# Patient Record
Sex: Female | Born: 1950 | Hispanic: No | Marital: Married | Smoking: Never smoker
Health system: Southern US, Community
[De-identification: ages and names within clinical notes are randomized; demographics above are authoritative.]

## PROBLEM LIST (undated history)

## (undated) DIAGNOSIS — K219 Gastro-esophageal reflux disease without esophagitis: Secondary | ICD-10-CM

## (undated) DIAGNOSIS — M25473 Effusion, unspecified ankle: Secondary | ICD-10-CM

## (undated) DIAGNOSIS — I82409 Acute embolism and thrombosis of unspecified deep veins of unspecified lower extremity: Secondary | ICD-10-CM

## (undated) DIAGNOSIS — R519 Headache, unspecified: Secondary | ICD-10-CM

## (undated) DIAGNOSIS — R51 Headache: Secondary | ICD-10-CM

## (undated) DIAGNOSIS — O2230 Deep phlebothrombosis in pregnancy, unspecified trimester: Secondary | ICD-10-CM

## (undated) DIAGNOSIS — O2200 Varicose veins of lower extremity in pregnancy, unspecified trimester: Secondary | ICD-10-CM

## (undated) DIAGNOSIS — M542 Cervicalgia: Secondary | ICD-10-CM

## (undated) DIAGNOSIS — J309 Allergic rhinitis, unspecified: Secondary | ICD-10-CM

## (undated) HISTORY — DX: Gastro-esophageal reflux disease without esophagitis: K21.9

## (undated) HISTORY — DX: Effusion, unspecified ankle: M25.473

## (undated) HISTORY — DX: Allergic rhinitis, unspecified: J30.9

## (undated) HISTORY — DX: Cervicalgia: M54.2

## (undated) HISTORY — DX: Varicose veins of lower extremity in pregnancy, unspecified trimester: O22.00

## (undated) HISTORY — DX: Headache, unspecified: R51.9

## (undated) HISTORY — PX: OTHER SURGICAL HISTORY: SHX169

## (undated) HISTORY — DX: Headache: R51

## (undated) HISTORY — PX: VARICOSE VEIN SURGERY: SHX832

---
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
## 2006-05-19 DIAGNOSIS — I82409 Acute embolism and thrombosis of unspecified deep veins of unspecified lower extremity: Secondary | ICD-10-CM

## 2006-05-19 HISTORY — DX: Acute embolism and thrombosis of unspecified deep veins of unspecified lower extremity: I82.409

## 2007-05-23 ENCOUNTER — Emergency Department: Payer: Self-pay | Admitting: Emergency Medicine

## 2007-05-24 ENCOUNTER — Other Ambulatory Visit: Payer: Self-pay

## 2007-05-24 IMAGING — CR DG KNEE COMPLETE 4+V*L*
1 series · 4 of 4 positions shown · non-contrast
Comparison: none

REASON FOR EXAM: Fall
COMMENTS:

PROCEDURE:     DXR - DXR KNEE LT COMP WITH OBLIQUES  - [DATE] [DATE]
RESULT:     No fracture, dislocation or other acute bony abnormality is
identified. There is slight degenerative spurring at the knee joint
laterally. The patella is intact.

[Series 1: view not recorded · 0.17mm/px · 4 of 4 slices shown]
[im 1/4]
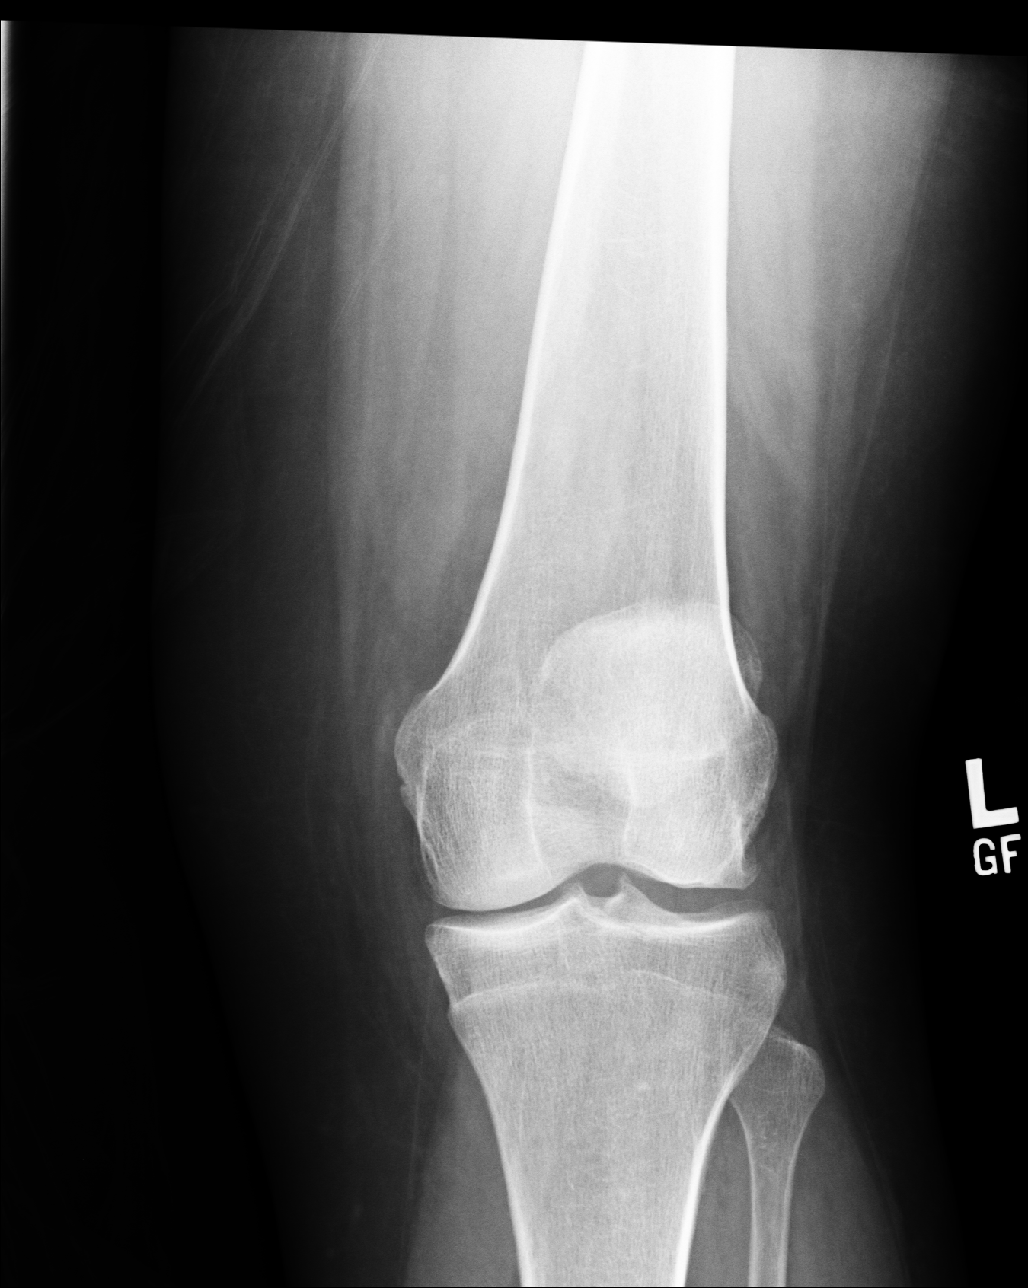
[im 2/4]
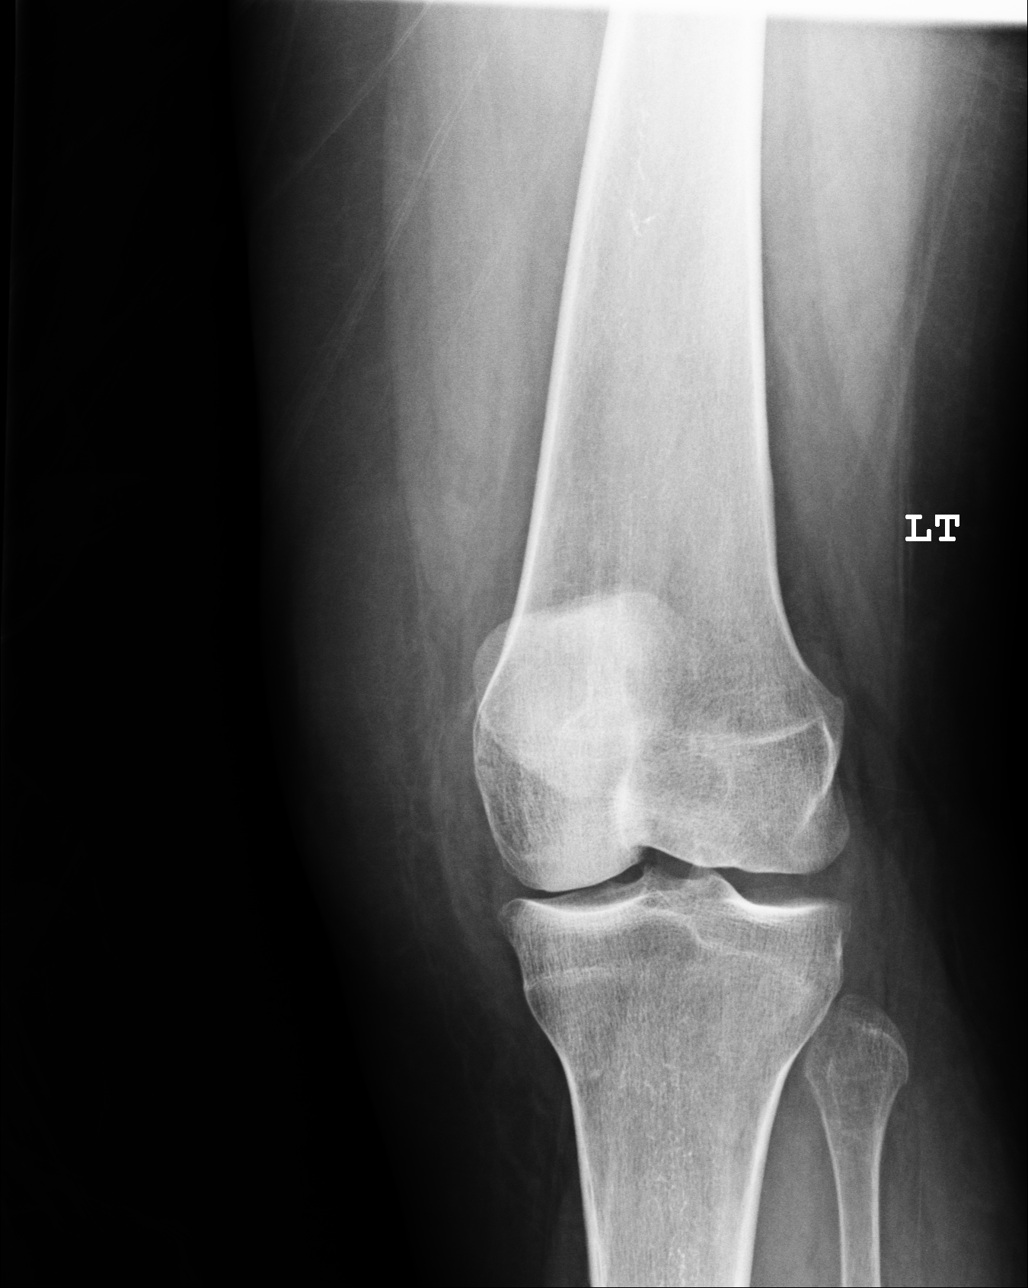
[im 3/4]
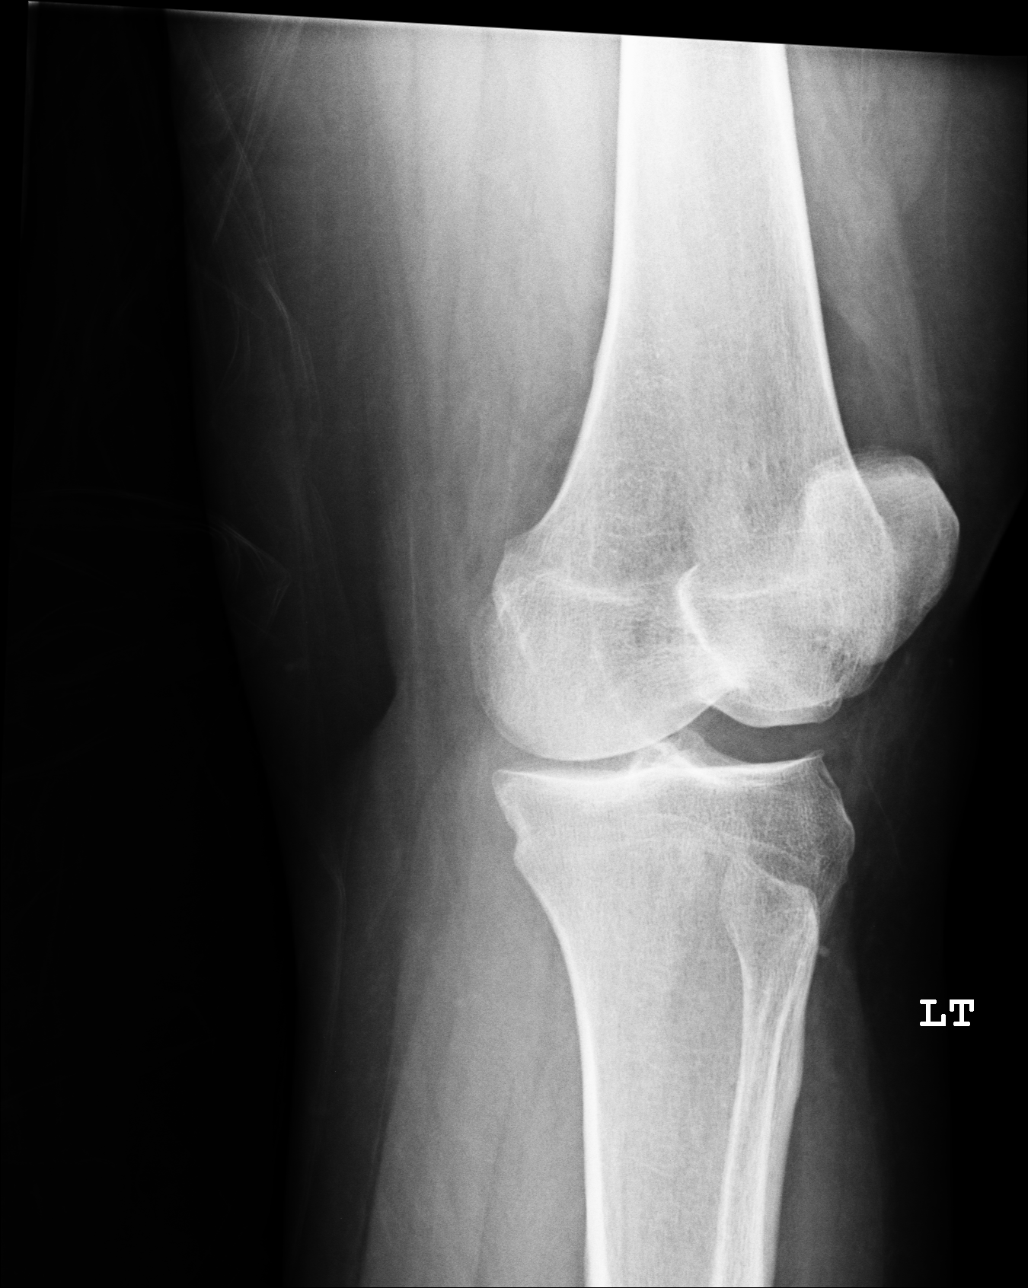
[im 4/4]
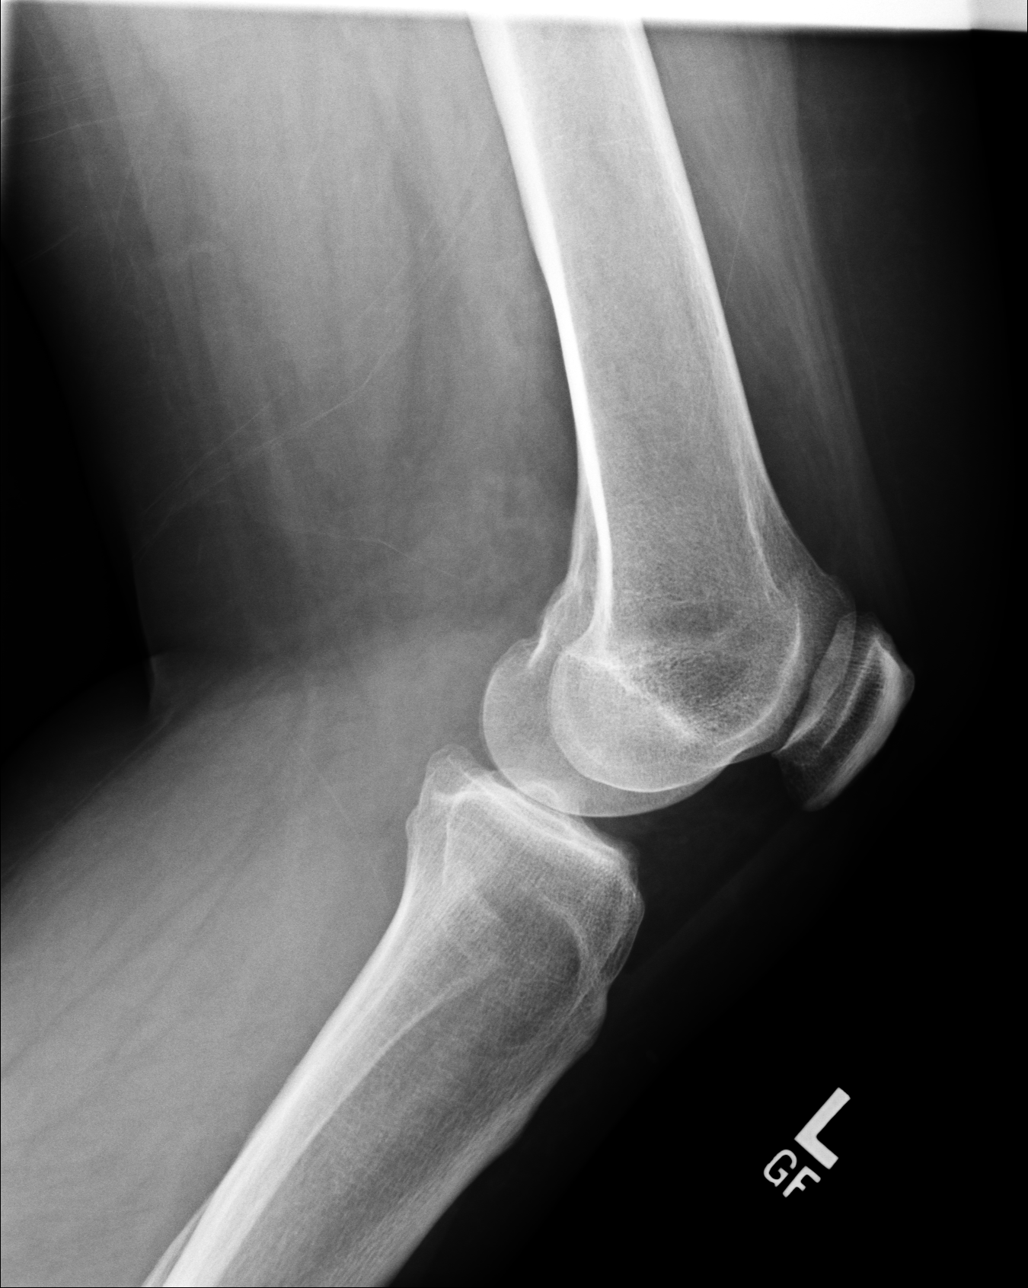

[4 of 4 positions shown; findings below may reference images not displayed]

IMPRESSION: 1.  No acute changes are identified.
2.  There is slight degenerative spurring at the knee joint laterally.

## 2007-06-18 ENCOUNTER — Ambulatory Visit: Payer: Self-pay | Admitting: Specialist

## 2008-02-25 ENCOUNTER — Observation Stay: Payer: Self-pay | Admitting: Internal Medicine

## 2008-02-25 ENCOUNTER — Other Ambulatory Visit: Payer: Self-pay

## 2009-07-06 ENCOUNTER — Observation Stay (HOSPITAL_COMMUNITY): Admission: EM | Admit: 2009-07-06 | Discharge: 2009-07-06 | Payer: Self-pay | Admitting: Emergency Medicine

## 2010-04-17 ENCOUNTER — Emergency Department: Payer: Self-pay | Admitting: Emergency Medicine

## 2010-08-08 LAB — CBC
HCT: 40.6 % (ref 36.0–46.0)
Hemoglobin: 13.8 g/dL (ref 12.0–15.0)
MCHC: 33.8 g/dL (ref 30.0–36.0)
MCV: 89.4 fL (ref 78.0–100.0)
Platelets: 242 10*3/uL (ref 150–400)
RBC: 4.55 MIL/uL (ref 3.87–5.11)
RDW: 13.3 % (ref 11.5–15.5)
WBC: 9 10*3/uL (ref 4.0–10.5)

## 2010-08-08 LAB — DIFFERENTIAL
Basophils Absolute: 0.1 10*3/uL (ref 0.0–0.1)
Basophils Relative: 1 % (ref 0–1)
Eosinophils Absolute: 0.1 10*3/uL (ref 0.0–0.7)
Eosinophils Relative: 1 % (ref 0–5)
Lymphocytes Relative: 26 % (ref 12–46)
Lymphs Abs: 2.3 10*3/uL (ref 0.7–4.0)
Monocytes Absolute: 1 10*3/uL (ref 0.1–1.0)
Monocytes Relative: 11 % (ref 3–12)
Neutro Abs: 5.5 10*3/uL (ref 1.7–7.7)
Neutrophils Relative %: 62 % (ref 43–77)

## 2010-08-08 LAB — POCT I-STAT, CHEM 8
BUN: 8 mg/dL (ref 6–23)
Calcium, Ion: 1.15 mmol/L (ref 1.12–1.32)
Chloride: 107 mEq/L (ref 96–112)
Creatinine, Ser: 0.7 mg/dL (ref 0.4–1.2)
Glucose, Bld: 104 mg/dL — ABNORMAL HIGH (ref 70–99)
HCT: 43 % (ref 36.0–46.0)
Hemoglobin: 14.6 g/dL (ref 12.0–15.0)
Potassium: 4.1 mEq/L (ref 3.5–5.1)
Sodium: 139 mEq/L (ref 135–145)
TCO2: 26 mmol/L (ref 0–100)

## 2011-08-01 ENCOUNTER — Other Ambulatory Visit: Payer: Self-pay | Admitting: Nephrology

## 2011-08-01 ENCOUNTER — Ambulatory Visit
Admission: RE | Admit: 2011-08-01 | Discharge: 2011-08-01 | Disposition: A | Discharge status: Home / Self Care | Payer: Medicaid Other | Source: Ambulatory Visit | Attending: Nephrology | Admitting: Nephrology

## 2011-08-01 DIAGNOSIS — R059 Cough, unspecified: Secondary | ICD-10-CM

## 2011-08-01 DIAGNOSIS — R05 Cough: Secondary | ICD-10-CM

## 2011-10-12 ENCOUNTER — Emergency Department (HOSPITAL_COMMUNITY)
Admission: EM | Admit: 2011-10-12 | Discharge: 2011-10-12 | Disposition: A | Discharge status: Home / Self Care | Payer: Medicaid Other | Attending: Emergency Medicine | Admitting: Emergency Medicine

## 2011-10-12 ENCOUNTER — Encounter (HOSPITAL_COMMUNITY): Payer: Self-pay | Admitting: Family Medicine

## 2011-10-12 DIAGNOSIS — M7989 Other specified soft tissue disorders: Secondary | ICD-10-CM

## 2011-10-12 DIAGNOSIS — I872 Venous insufficiency (chronic) (peripheral): Secondary | ICD-10-CM

## 2011-10-12 DIAGNOSIS — M79609 Pain in unspecified limb: Secondary | ICD-10-CM | POA: Insufficient documentation

## 2011-10-12 HISTORY — DX: Deep phlebothrombosis in pregnancy, unspecified trimester: O22.30

## 2011-10-12 NOTE — ED Notes (Signed)
Per pt having left leg swelling and pain x 1week. Hx of DVT

## 2011-10-12 NOTE — Discharge Instructions (Signed)
Venous Stasis and Chronic Venous Insufficiency As people age, the veins located in their legs may weaken and stretch. When veins weaken and lose the ability to pump blood effectively, the condition is called chronic venous insufficiency (CVI) or venous stasis. Almost all veins return blood back to the heart. This happens by:  The force of the heart pumping fresh blood pushes blood back to the heart.   Blood flowing to the heart from the force of gravity.  In the deep veins of the legs, blood has to fight gravity and flow upstream back to the heart. Here, the leg muscles contract to pump blood back toward the heart. Vein walls are elastic, and many veins have small valves that only allow blood to flow in one direction. When leg muscles contract, they push inward against the elastic vein walls. This squeezes blood upward, opens the valves, and moves blood toward the heart. When leg muscles relax, the vein wall also relaxes and the valves inside the vein close to prevent blood from flowing backward. This method of pumping blood out of the legs is called the venous pump. CAUSES  The venous pump works best while walking and leg muscles are contracting. But when a person sits or stands, blood pressure in leg veins can build. Deep veins are usually able to withstand short periods of inactivity, but long periods of inactivity (and increased pressure) can stretch, weaken, and damage vein walls. High blood pressure can also stretch and damage vein walls. The veins may no longer be able to pump blood back to the heart. Venous hypertension (high blood pressure inside veins) that lasts over time is a primary cause of CVI. CVI can also be caused by:   Deep vein thrombosis, a condition where a thrombus (blood clot) blocks blood flow in a vein.   Phlebitis, an inflammation of a superficial vein that causes a blood clot to form.  Other risk factors for CVI may include:   Heredity.   Obesity.   Pregnancy.    Sedentary lifestyle.   Smoking.   Jobs requiring long periods of standing or sitting in one place.   Age and gender:   Women in their 40's and 50's and men in their 70's are more prone to developing CVI.  SYMPTOMS  Symptoms of CVI may include:   Varicose veins.   Ulceration or skin breakdown.   Lipodermatosclerosis, a condition that affects the skin just above the ankle, usually on the inside surface. Over time the skin becomes Gallardo, smooth, tight and often painful. Those with this condition have a high risk of developing skin ulcers.   Reddened or discolored skin on the leg.   Swelling.  DIAGNOSIS  Your caregiver can diagnose CVI after performing a careful medical history and physical examination. To confirm the diagnosis, the following tests may also be ordered:   Duplex ultrasound.   Plethysmography (tests blood flow).   Venograms (x-ray using a special dye).  TREATMENT The goals of treatment for CVI are to restore a person to an active life and to minimize pain or disability. Typically, CVI does not pose a serious threat to life or limb, and with proper treatment most people with this condition can continue to lead active lives. In most cases, mild CVI can be treated on an outpatient basis with simple procedures. Treatment methods include:   Elastic compression socks.   Sclerotherapy, a procedure involving an injection of a material that "dissolves" the damaged veins. Other veins in the network   of blood vessels take over the function of the damaged veins.   Vein stripping (an older procedure less commonly used).   Laser Ablation surgery.   Valve repair.  HOME CARE INSTRUCTIONS   Elastic compression socks must be worn every day. They can help with symptoms and lower the chances of the problem getting worse, but they do not cure the problem.   Only take over-the-counter or prescription medicines for pain, discomfort, or fever as directed by your caregiver.    Your caregiver will review your other medications with you.  SEEK MEDICAL CARE IF:   You are confused about how to take your medications.   There is redness, swelling, or increasing pain in the affected area.   There is a red streak or line that extends up or down from the affected area.   There is a breakdown or loss of skin in the affected area, even if the breakdown is small.   You develop an unexplained oral temperature above 102 F (38.9 C).   There is an injury to the affected area.  SEEK IMMEDIATE MEDICAL CARE IF:   There is an injury and open wound to the affected area.   Pain is not adequately relieved with pain medication prescribed or becomes severe.   An oral temperature above 102 F (38.9 C) develops.   The foot/ankle below the affected area becomes suddenly numb or the area feels weak and hard to move.  MAKE SURE YOU:   Understand these instructions.   Will watch your condition.   Will get help right away if you are not doing well or get worse.  Document Released: 09/08/2006 Document Revised: 04/24/2011 Document Reviewed: 11/16/2006 [employer] Patient Information 2012 [employer]. 

## 2011-10-12 NOTE — Progress Notes (Signed)
VASCULAR LAB PRELIMINARY  PRELIMINARY  PRELIMINARY  PRELIMINARY  Left lower extremity venous Doppler completed.    Preliminary report:  There is no DVT or SVT noted in the bilateral lower extremities.  Enlarged lymph nodes noted in the bilateral groins.  Pamela Flores, 10/12/2011, 5:39 PM

## 2011-10-12 NOTE — ED Provider Notes (Signed)
History   This chart was scribed for Lyanne Co, MD by Toya Smothers. The patient was seen in room STRE2/STRE2. Patient's care was started at 1418.  CSN: [id number]  Arrival date & time 10/12/11  1418   First MD Initiated Contact with Patient 10/12/11 1524      Chief Complaint  Patient presents with  . Leg Swelling    HPI  Pamela Flores is a 61 y.o. female who presents to the Emergency Department complaining of gradual onset moderate severe constant leg swelling in both legs onset 1 week ago with associated black spotting. denying chest pain and SOB. Pt states that her ankles typically experience swelling, however swelling in the legs has not occurred before. Pt has h/o DVT, is not a smoker or drinker.  Her symptoms are mild to moderate in severity.  She denies chest pain or shortness breath  Pt list PCP as Dr. Cory Roughen  Past Medical History  Diagnosis Date  . DVT (deep vein thrombosis) in pregnancy     History reviewed. No pertinent past surgical history.  History reviewed. No pertinent family history.  History  Substance Use Topics  . Smoking status: Never Smoker   . Smokeless tobacco: Not on file  . Alcohol Use: No   Review of Systems  Constitutional: Negative for fever and chills.  Respiratory: Negative for shortness of breath.   Cardiovascular: Positive for leg swelling.  Gastrointestinal: Negative for nausea and vomiting.  Skin: Positive for color change.  Neurological: Negative for weakness.    Allergies  Review of patient's allergies indicates no known allergies.  Home Medications  No current outpatient prescriptions on file.  BP 114/79  Pulse 62  Temp(Src) 98.1 F (36.7 C) (Oral)  Resp 21  SpO2 100%  Physical Exam  Nursing note and vitals reviewed. Constitutional: She is oriented to person, place, and time. She appears well-developed and well-nourished. No distress.  HENT:  Head: Normocephalic and atraumatic.  Eyes: EOM are normal.  Neck:  Neck supple. No tracheal deviation present.  Cardiovascular: Normal rate.   Pulmonary/Chest: Effort normal. No respiratory distress.  Musculoskeletal: Normal range of motion.       Normal dp and dp foot . Swelling greater in L leg than R. 1+ edema in L leg. Tenderness to L calf with palpation. No tenderness or swelling of thighs bilaterally.  Neurological: She is alert and oriented to person, place, and time.  Skin: Skin is warm and dry.  Psychiatric: She has a normal mood and affect. Her behavior is normal.    ED Course  Procedures (including critical care time)  DIAGNOSTIC STUDIES: Oxygen Saturation is 100% on room air, normal by my interpretation.    COORDINATION OF CARE:  Labs Reviewed - No data to display No results found.   1. Venous insufficiency       MDM  No evidence of DVT on venous duplex imaging at bedside.  Please see complete hospital chart for details of the ultrasound.  I recommended compression stockings elevation and reduction in the salt in her diet.  I also recommended) care followup.  There are no signs of infection at this time.  She is neurovascularly intact.  I think this is chronic venous insufficiencyd    I personally performed the services described in this documentation, which was scribed in my presence. The recorded information has been reviewed and considered.     Lyanne Co, MD 10/12/11 1750

## 2012-08-10 ENCOUNTER — Encounter (HOSPITAL_COMMUNITY): Payer: Self-pay | Admitting: Emergency Medicine

## 2012-08-10 ENCOUNTER — Emergency Department (INDEPENDENT_AMBULATORY_CARE_PROVIDER_SITE_OTHER)
Admission: EM | Admit: 2012-08-10 | Discharge: 2012-08-10 | Disposition: A | Discharge status: Home / Self Care | Payer: Medicaid Other | Source: Home / Self Care | Attending: Family Medicine | Admitting: Family Medicine

## 2012-08-10 DIAGNOSIS — I8310 Varicose veins of unspecified lower extremity with inflammation: Secondary | ICD-10-CM

## 2012-08-10 DIAGNOSIS — I8312 Varicose veins of left lower extremity with inflammation: Secondary | ICD-10-CM

## 2012-08-10 NOTE — ED Provider Notes (Signed)
History     CSN: [id number]  Arrival date & time 08/10/12  1623   First MD Initiated Contact with Patient 08/10/12 1626      Chief Complaint  Patient presents with  . Leg Swelling    lower left leg swelling with discoloration  . Cough    dry nonproductive cough. that is persistant    (Consider location/radiation/quality/duration/timing/severity/associated sxs/prior treatment) Patient is a 62 y.o. female presenting with leg pain. The history is provided by the patient.  Leg Pain Location:  Leg Leg location:  L lower leg Pain details:    Quality:  Burning and cramping   Severity:  Mild   Duration:  12 months   Progression:  Worsening (has had doppler done prev and told just to wear support hose on leg, sx getting worse.)   Past Medical History  Diagnosis Date  . DVT (deep vein thrombosis) in pregnancy     History reviewed. No pertinent past surgical history.  History reviewed. No pertinent family history.  History  Substance Use Topics  . Smoking status: Never Smoker   . Smokeless tobacco: Not on file  . Alcohol Use: No    OB History   Grav Para Term Preterm Abortions TAB SAB Ect Mult Living                  Review of Systems  Constitutional: Negative.   Musculoskeletal: Negative for joint swelling and gait problem.  Skin: Positive for color change and rash.    Allergies  Review of patient's allergies indicates no known allergies.  Home Medications  No current outpatient prescriptions on file.  BP 129/89  Pulse 69  Temp(Src) 98.4 F (36.9 C) (Oral)  Resp 20  SpO2 100%  Physical Exam  Nursing note and vitals reviewed. Constitutional: She is oriented to person, place, and time. She appears well-developed and well-nourished.  Neurological: She is alert and oriented to person, place, and time.  Skin: Skin is warm and dry. Rash noted.  Edema and stasis dermatitis skin changes to left lower leg, prominent varicosities, no ulcerations, no  erythema.pulse 2+.    ED Course  Procedures (including critical care time)  Labs Reviewed - No data to display No results found.   1. Chronic stasis dermatitis, left       MDM          Linna Hoff, MD 08/10/12 [phone]

## 2012-08-10 NOTE — ED Notes (Addendum)
Pt c/o left lower leg swelling with discoloration. Having sharp, shooting pain. Symptoms present x 2 months.   Pt has been using compression stockings with mild relief. Hx of DVT  No hx of diabetes. Pt is also c/o a dry nonproductive cough that has been persistent over the past several weeks. Denies chest pain and sob

## 2012-11-05 ENCOUNTER — Telehealth: Payer: Self-pay | Admitting: *Deleted

## 2012-11-05 NOTE — Telephone Encounter (Signed)
Patient called in to triage re: Leg pain and swelling due to VV and venous stasis. Seen in urgent care a couple of months ago according to [facility].  Patient has not been seen by our doctors and does not have an appt to see [place] as of yet: I left msg for her to call our appt desk and make appt as a self referral for VV and venous stasis problems.

## 2012-11-11 NOTE — ED Notes (Signed)
Spoke to [place] at dr lawson's office.  Pamela Flores is sending referral to [facility] to be completed and sent back.

## 2012-11-15 ENCOUNTER — Other Ambulatory Visit: Payer: Self-pay | Admitting: *Deleted

## 2012-11-15 DIAGNOSIS — I83893 Varicose veins of bilateral lower extremities with other complications: Secondary | ICD-10-CM

## 2012-12-10 ENCOUNTER — Encounter: Payer: Self-pay | Admitting: Surgery

## 2012-12-13 ENCOUNTER — Encounter (INDEPENDENT_AMBULATORY_CARE_PROVIDER_SITE_OTHER): Payer: Self-pay | Admitting: Vascular Surgery

## 2012-12-13 ENCOUNTER — Ambulatory Visit (INDEPENDENT_AMBULATORY_CARE_PROVIDER_SITE_OTHER): Payer: Self-pay | Admitting: Vascular Surgery

## 2012-12-13 ENCOUNTER — Encounter: Payer: Self-pay | Admitting: Vascular Surgery

## 2012-12-13 VITALS — BP 123/80 | HR 71 | Resp 16 | Ht 68.0 in | Wt 215.0 lb

## 2012-12-13 DIAGNOSIS — I83893 Varicose veins of bilateral lower extremities with other complications: Secondary | ICD-10-CM

## 2012-12-13 DIAGNOSIS — M7989 Other specified soft tissue disorders: Secondary | ICD-10-CM

## 2012-12-13 DIAGNOSIS — Z86718 Personal history of other venous thrombosis and embolism: Secondary | ICD-10-CM

## 2012-12-13 NOTE — Progress Notes (Signed)
Subjective:     Patient ID: Pamela Flores, female   DOB: [birth year], 62 y.o.   MRN: [id number]  HPI this 60-year-old female is referred for evaluation of pain and swelling in the left leg. She states that 4 years ago she fell and injured her left leg. It was later discovered that she had developed a DVT. She was treated with Coumadin for one year and she quit taking the Coumadin on her own. She is not take aspirin. She has no history of other thrombotic problems including pulmonary embolus. She has noted some mild swelling in the contralateral right leg. She has aching and throbbing discomfort throughout the day. She has no history of bleeding or stasis ulcers although she has noticed a small "scar" in the posterior aspect of her left ankle. She is not wear elastic compression stockings nor elevate her legs on a regular basis.  Past Medical History  Diagnosis Date  . DVT (deep vein thrombosis) in pregnancy     History  Substance Use Topics  . Smoking status: Never Smoker   . Smokeless tobacco: Not on file  . Alcohol Use: No    History reviewed. No pertinent family history.  No Known Allergies  No current outpatient prescriptions on file.  BP 123/80  Pulse 71  Resp 16  Ht 5\' 8"  (1.727 m)  Wt 215 lb (97.523 kg)  BMI 32.7 kg/m2  Body mass index is 32.7 kg/(m^2).          Review of Systems denies chest pain, dyspnea on exertion, PND, orthopnea. Does have a history of productive cough, swelling in legs, history of DVT. Other systems negative and complete review of systems    Objective:   Physical Exam blood pressure 120 for bradycardia heart rate 71 respirations 16 Gen.-alert and oriented x3 in no apparent distress HEENT normal for age Lungs no rhonchi or wheezing Cardiovascular regular rhythm no murmurs carotid pulses 3+ palpable no bruits audible Abdomen soft nontender no palpable masses Musculoskeletal free of  major deformities Skin clear -no rashes Neurologic  normal Lower extremities 3+ femoral and dorsalis pedis pulses palpable bilaterally with 2+ edema left calf ankle and foot and 1+ edema right lower extremity below the knee. No active ulceration noted in the left leg although there is a small eschar measuring about 3 mm in size and posterior laterally near the lateral malleolus which she states has been present for several months. No bulging varicosities noted. Spider veins bilaterally and medial and lateral anterior thigh area.  Today I ordered a venous duplex exam of the left leg which are reviewed and interpreted. Patient has findings consistent with chronic DVT which is nonocclusive in the popliteal and distal superficial femoral vein. There is incompetence of the left small saphenous vein which has some nonoccluding thrombus in the proximal calf segment. The left great saphenous vein is competent.     Assessment:     Chronic venous insufficiency left leg secondary to chronic DVT with episode of chronic superficial thrombophlebitis and small saphenous vein which occurred in the past Chronic pain and swelling left leg secondary to above    Plan:    #1 short leg elastic compression stockings 20-30 mm gradient #2 elevate foot of bed 3-4 inches and apply stockings first laying in the a.m. to both lower extremities #3 no further suggestions-no need for chronic anticoagulation at this point unless she develops further DVT

## 2013-07-28 ENCOUNTER — Emergency Department: Payer: Self-pay | Admitting: Emergency Medicine

## 2013-08-03 ENCOUNTER — Emergency Department: Payer: Self-pay | Admitting: Internal Medicine

## 2013-09-26 ENCOUNTER — Encounter: Payer: Self-pay | Admitting: Vascular Surgery

## 2013-09-26 ENCOUNTER — Other Ambulatory Visit: Payer: Self-pay | Admitting: *Deleted

## 2013-09-26 DIAGNOSIS — M7989 Other specified soft tissue disorders: Secondary | ICD-10-CM

## 2013-11-14 ENCOUNTER — Encounter: Payer: Self-pay | Admitting: Vascular Surgery

## 2013-11-15 ENCOUNTER — Encounter: Payer: 59 | Admitting: Vascular Surgery

## 2013-11-15 ENCOUNTER — Ambulatory Visit (HOSPITAL_COMMUNITY)
Admission: RE | Admit: 2013-11-15 | Discharge: 2013-11-15 | Disposition: A | Discharge status: Home / Self Care | Payer: 59 | Source: Ambulatory Visit | Attending: Vascular Surgery | Admitting: Vascular Surgery

## 2013-11-15 DIAGNOSIS — M7989 Other specified soft tissue disorders: Secondary | ICD-10-CM

## 2013-11-21 ENCOUNTER — Encounter: Payer: Self-pay | Admitting: Vascular Surgery

## 2013-11-22 ENCOUNTER — Encounter: Payer: Self-pay | Admitting: Vascular Surgery

## 2013-11-22 ENCOUNTER — Ambulatory Visit (INDEPENDENT_AMBULATORY_CARE_PROVIDER_SITE_OTHER): Payer: 59 | Admitting: Vascular Surgery

## 2013-11-22 VITALS — BP 120/83 | HR 71 | Resp 16 | Ht 68.0 in | Wt 235.0 lb

## 2013-11-22 DIAGNOSIS — M79669 Pain in unspecified lower leg: Secondary | ICD-10-CM | POA: Insufficient documentation

## 2013-11-22 DIAGNOSIS — M79609 Pain in unspecified limb: Secondary | ICD-10-CM

## 2013-11-22 DIAGNOSIS — I83893 Varicose veins of bilateral lower extremities with other complications: Secondary | ICD-10-CM

## 2013-11-22 DIAGNOSIS — M7989 Other specified soft tissue disorders: Secondary | ICD-10-CM

## 2013-11-22 NOTE — Progress Notes (Signed)
The patient has today for continued discussion of her chronic venous hypertension. She saw Dr. Kellie Simmering one year ago. She had an ultrasound that time and she recently had a repeat ultrasound on 11/15/2013 and I am reviewing this with her today. She does have a history of DVT after a fall in her left leg artery 4-5 years ago. She has had progression of her venous hypertension since that time. She reports this is much worse on the left side but she does have some swelling in her right side as well. She does wear her compression at times but is concern regarding the appearance of these. She does elevate when possible as well. She's had no new symptoms of DVT.  Past Medical History  Diagnosis Date  . DVT (deep vein thrombosis) in pregnancy   . Persistent headaches   . Neck pain   . Ankle swelling     History  Substance Use Topics  . Smoking status: Never Smoker   . Smokeless tobacco: Never Used  . Alcohol Use: No    No family history on file.  No Known Allergies  No current outpatient prescriptions on file.  BP 120/83  Pulse 71  Resp 16  Ht 5\' 8"  (1.727 m)  Wt 235 lb (106.595 kg)  BMI 35.74 kg/m2  Body mass index is 35.74 kg/(m^2).       Physical exam well-developed female in no acute distress Grossly intact neurologically Significant swelling in her left leg versus right. She does have 2+ dorsalis pedis pulses bilaterally. On the left she does have significant hemosiderin deposit and thickening over medial to ankle above her malleolus. Does have some swelling onto her foot as well. No specific swelling in her right leg.  Venous duplex from 11/15/2013 was compared to her study from a year ago. This does show chronic nonocclusive DVT in her left femoral popliteal and posterior tibial veins does have some chronic thrombus in her right great saphenous vein and left small saphenous vein. Has deep venous reflux in bilateral common femoral femoral and tibial veins and left popliteal  vein.  I again discussed with the patient there is no specific treatment other than elevation and compression. The critical importance of this. Did explain she has had rather marked progression of her for 5 years since her DVT and this is concerning for continued skin changes. I explained this could progress to of venous ulceration. I think this is not life-threatening. She is frustrated that no other treatment options. She will continue with elevation and compression and see [place] on an as-needed basis

## 2016-09-22 ENCOUNTER — Emergency Department (HOSPITAL_COMMUNITY)
Admission: EM | Admit: 2016-09-22 | Discharge: 2016-09-22 | Disposition: A | Discharge status: Home / Self Care | Payer: Self-pay | Attending: Emergency Medicine | Admitting: Emergency Medicine

## 2016-09-22 ENCOUNTER — Encounter (HOSPITAL_COMMUNITY): Payer: Self-pay | Admitting: Emergency Medicine

## 2016-09-22 DIAGNOSIS — H1033 Unspecified acute conjunctivitis, bilateral: Secondary | ICD-10-CM | POA: Insufficient documentation

## 2016-09-22 DIAGNOSIS — M25471 Effusion, right ankle: Secondary | ICD-10-CM

## 2016-09-22 DIAGNOSIS — R600 Localized edema: Secondary | ICD-10-CM | POA: Insufficient documentation

## 2016-09-22 DIAGNOSIS — M25472 Effusion, left ankle: Secondary | ICD-10-CM

## 2016-09-22 LAB — CBC
HCT: 42 % (ref 36.0–46.0)
Hemoglobin: 13.7 g/dL (ref 12.0–15.0)
MCH: 29.4 pg (ref 26.0–34.0)
MCHC: 32.6 g/dL (ref 30.0–36.0)
MCV: 90.1 fL (ref 78.0–100.0)
Platelets: 217 10*3/uL (ref 150–400)
RBC: 4.66 MIL/uL (ref 3.87–5.11)
RDW: 13.1 % (ref 11.5–15.5)
WBC: 6.4 10*3/uL (ref 4.0–10.5)

## 2016-09-22 LAB — COMPREHENSIVE METABOLIC PANEL
ALT: 44 U/L (ref 14–54)
AST: 49 U/L — ABNORMAL HIGH (ref 15–41)
Albumin: 3.7 g/dL (ref 3.5–5.0)
Alkaline Phosphatase: 73 U/L (ref 38–126)
Anion gap: 7 (ref 5–15)
BUN: 14 mg/dL (ref 6–20)
CO2: 25 mmol/L (ref 22–32)
Calcium: 9.5 mg/dL (ref 8.9–10.3)
Chloride: 108 mmol/L (ref 101–111)
Creatinine, Ser: 0.91 mg/dL (ref 0.44–1.00)
GFR calc Af Amer: >60 mL/min (ref >60)
GFR calc non Af Amer: >60 mL/min (ref >60)
Glucose, Bld: 89 mg/dL (ref 65–99)
Potassium: 3.9 mmol/L (ref 3.5–5.1)
Sodium: 140 mmol/L (ref 135–145)
Total Bilirubin: 0.7 mg/dL (ref 0.3–1.2)
Total Protein: 8 g/dL (ref 6.5–8.1)

## 2016-09-22 MED ORDER — TOBRAMYCIN 0.3 % OP SOLN
1.0000 [drp] | Freq: Once | OPHTHALMIC | Status: AC
  Administered 2016-09-22: 1 [drp] via OPHTHALMIC
  Filled 2016-09-22: qty 5

## 2016-09-22 NOTE — ED Provider Notes (Addendum)
[facility] DEPT Provider Note   CSN: [id number] Arrival date & time: 09/22/16  1321   By signing my name below, I, Pamela Flores, attest that this documentation has been prepared under the direction and in the presence of Lajean Saver, MD. Electronically Signed: Soijett Flores, ED Scribe. 09/22/16. 2:00 PM.  History   Chief Complaint Chief Complaint  Patient presents with  . Eye Drainage    HPI Pamela Flores is a 66 y.o. female who presents to the Emergency Department complaining of constant bilateral eye drainage onset 3 weeks ago. Pt reports associated bilateral eye crusting, bilateral eye redness, and cough. Pt has tried OTC eye drops and robitussin with no relief of her symptoms. Denies using new hair or facial products prior to the onset of her symptoms. She denies eye pain, rhinorrhea, sore throat, and any other symptoms. Denies having an eye doctor, wearing contacts, or hx of seasonal allergies.   She secondarily complains of bilateral foot and ankle swelling onset 3 weeks ago. She notes that she has a hx of LLE DVT s/p fall several years ago, but has never experienced BLE swelling. Pt hasn't tried any medications for the relief of her symptoms. Denies PMHx of CHF, kidney issues, liver issues, or change in diet prior to the onset of her symptoms. Denies SOB, bilateral foot pain, bilateral ankle pain, and any other symptoms. Denies having a PCP or taking daily prescription medications.   The history is provided by the patient. No language interpreter was used.    Past Medical History:  Diagnosis Date  . Ankle swelling   . DVT (deep vein thrombosis) in pregnancy ([place])   . Neck pain   . Persistent headaches     Patient Active Problem List   Diagnosis Date Noted  . Pain and swelling of lower leg 11/22/2013  . Varicose veins of lower extremities with other complications 05/27/3233    History reviewed. No pertinent surgical history.  OB History    No data available        Home Medications    Prior to Admission medications   Not on File    Family History History reviewed. No pertinent family history.  Social History Social History  Substance Use Topics  . Smoking status: Never Smoker  . Smokeless tobacco: Never Used  . Alcohol use No     Allergies   Patient has no known allergies.   Review of Systems Review of Systems  HENT: Negative for rhinorrhea and sore throat.   Eyes: Positive for discharge (bilaterally) and redness (bilaterally). Negative for pain.       +Bilateral eye crusting  Respiratory: Negative for shortness of breath.   Musculoskeletal: Positive for joint swelling (bilateral feet and ankles). Negative for arthralgias.     Physical Exam Updated Vital Signs BP 139/90 (BP Location: Left Arm)   Pulse 66   Temp 97.7 F (36.5 C) (Oral)   Resp 18   Ht 5\' 8"  (1.727 m)   Wt 235 lb (106.6 kg)   SpO2 100%   BMI 35.73 kg/m   Physical Exam  Constitutional: She appears well-developed and well-nourished. No distress.  HENT:  Head: Normocephalic and atraumatic.  Mouth/Throat: Uvula is midline, oropharynx is clear and moist and mucous membranes are normal.  Eyes: EOM are normal. Right conjunctiva is injected. Left conjunctiva is injected.  Conjunctiva injected with tearing bilaterally. Mild matting on lashes. No cellulitis  Neck: Neck supple.  Cardiovascular: Normal rate, regular rhythm and normal heart sounds.  Exam reveals no gallop and no friction rub.   No murmur heard. Pulmonary/Chest: Effort normal and breath sounds normal. No respiratory distress. She has no wheezes. She has no rales.  Abdominal: She exhibits no distension.  Musculoskeletal: Normal range of motion. She exhibits edema.       Right ankle: She exhibits swelling.       Left ankle: She exhibits swelling.       Right foot: There is swelling.       Left foot: There is swelling.  Moderate bilateral foot and ankle edema,symmetric. Distal pulse palp bil.    Neurological: She is alert.  Skin: Skin is warm and dry. No rash noted.  Psychiatric: She has a normal mood and affect. Her behavior is normal.  Nursing note and vitals reviewed.    ED Treatments / Results  DIAGNOSTIC STUDIES: Oxygen Saturation is 100% on RA, nl by my interpretation.    COORDINATION OF CARE: 1:57 PM Discussed treatment plan with pt at bedside which includes labs and pt agreed to plan.   Labs (all labs ordered are listed, but only abnormal results are displayed) Results for orders placed or performed during the hospital encounter of 09/22/16  CBC  Result Value Ref Range   WBC 6.4 4.0 - 10.5 K/uL   RBC 4.66 3.87 - 5.11 MIL/uL   Hemoglobin 13.7 12.0 - 15.0 g/dL   HCT 42.0 36.0 - 46.0 %   MCV 90.1 78.0 - 100.0 fL   MCH 29.4 26.0 - 34.0 pg   MCHC 32.6 30.0 - 36.0 g/dL   RDW 13.1 11.5 - 15.5 %   Platelets 217 150 - 400 K/uL  Comprehensive metabolic panel  Result Value Ref Range   Sodium 140 135 - 145 mmol/L   Potassium 3.9 3.5 - 5.1 mmol/L   Chloride 108 101 - 111 mmol/L   CO2 25 22 - 32 mmol/L   Glucose, Bld 89 65 - 99 mg/dL   BUN 14 6 - 20 mg/dL   Creatinine, Ser 0.91 0.44 - 1.00 mg/dL   Calcium 9.5 8.9 - 10.3 mg/dL   Total Protein 8.0 6.5 - 8.1 g/dL   Albumin 3.7 3.5 - 5.0 g/dL   AST 49 (H) 15 - 41 U/L   ALT 44 14 - 54 U/L   Alkaline Phosphatase 73 38 - 126 U/L   Total Bilirubin 0.7 0.3 - 1.2 mg/dL   GFR calc non Af Amer >60 >60 mL/min   GFR calc Af Amer >60 >60 mL/min   Anion gap 7 5 - 15    Procedures Procedures (including critical care time)  Medications Ordered in ED Medications - No data to display   Initial Impression / Assessment and Plan / ED Course  I have reviewed the triage vital signs and the nursing notes.  Pertinent labs that were available during my care of the patient were reviewed by me and considered in my medical decision making (see chart for details).  Labs unremarkable.   Exam c/w conjunctivitis.    For edema bil  foot/ankle, rec elevation, limit salt intake and pcp f/u.  Final Clinical Impressions(s) / ED Diagnoses   Final diagnoses:  None    New Prescriptions New Prescriptions   No medications on file   I personally performed the services described in this documentation, which was scribed in my presence. The recorded information has been reviewed and considered. Lajean Saver, MD       Lajean Saver, MD 09/22/16 [phone]

## 2016-09-22 NOTE — ED Notes (Signed)
Declined W/C at D/C and was escorted to lobby by RN. 

## 2016-09-22 NOTE — ED Triage Notes (Signed)
Pt to ER for evaluation of watery eyes x3 weeks and bilateral foot swelling that has been persistent "for a while." denies other symptoms. Ambulatory at present. VSS.

## 2016-09-22 NOTE — Discharge Instructions (Signed)
It was our pleasure to provide your ER care today - we hope that you feel better.  Your lab work looks good/normal.  Use tobrex eye drops, 1-2 drops in each eye 4x/day for the next 5 days.  For feet/ankle swelling, limit salt intake, use compression stockings, and elevated feet as much as possible.  Follow up with primary care doctor in the next 1-2 weeks.   Return to ER if worse, severe eye pain, change in vision, trouble breathing, or other concern.

## 2016-12-23 ENCOUNTER — Emergency Department (HOSPITAL_COMMUNITY)
Admission: EM | Admit: 2016-12-23 | Discharge: 2016-12-23 | Disposition: A | Discharge status: Home / Self Care | Payer: Worker's Compensation | Attending: Emergency Medicine | Admitting: Emergency Medicine

## 2016-12-23 ENCOUNTER — Encounter (HOSPITAL_COMMUNITY): Payer: Self-pay | Admitting: Emergency Medicine

## 2016-12-23 ENCOUNTER — Emergency Department (HOSPITAL_COMMUNITY): Payer: Worker's Compensation

## 2016-12-23 DIAGNOSIS — Y990 Civilian activity done for income or pay: Secondary | ICD-10-CM | POA: Diagnosis not present

## 2016-12-23 DIAGNOSIS — Y9389 Activity, other specified: Secondary | ICD-10-CM | POA: Insufficient documentation

## 2016-12-23 DIAGNOSIS — Y929 Unspecified place or not applicable: Secondary | ICD-10-CM | POA: Insufficient documentation

## 2016-12-23 DIAGNOSIS — S9031XA Contusion of right foot, initial encounter: Secondary | ICD-10-CM | POA: Diagnosis not present

## 2016-12-23 DIAGNOSIS — W208XXA Other cause of strike by thrown, projected or falling object, initial encounter: Secondary | ICD-10-CM | POA: Diagnosis not present

## 2016-12-23 DIAGNOSIS — S99921A Unspecified injury of right foot, initial encounter: Secondary | ICD-10-CM | POA: Diagnosis present

## 2016-12-23 MED ORDER — MELOXICAM 15 MG PO TABS: 15.0000 mg | ORAL_TABLET | Freq: Every day | ORAL | 0 refills | Status: DC

## 2016-12-23 NOTE — ED Triage Notes (Signed)
Pt reports to ED after dropping a commercial size roll of plastic wrap on right foot. Pt has swelling to right foot into toes. Pt ambulatory.

## 2016-12-23 NOTE — ED Provider Notes (Signed)
[facility] DEPT Provider Note   CSN: [id number] Arrival date & time: 12/23/16  1349     History   Chief Complaint Chief Complaint  Patient presents with  . Foot Injury    HPI Pamela Flores is a 66 y.o. female who presents emergency Department with chief complaint of foot injury. Patient states that she was lifting a commercial grade cellophane wrapper in a box when the bottom of the box and the extremely heavy cellophane dropped directly onto her right foot. She had significant pain and was unable to ambulate, but did have a speaker at her work that she was responsible for her and so propped up her foot and stayed until evaluation now. She states she has been able to walk on her heel but has significant pain when trying to bear weight on her forefoot. She denies previous injuries to this area. She denies numbness or tingling. She has no other injuries.   Foot Injury      Past Medical History:  Diagnosis Date  . Ankle swelling   . DVT (deep vein thrombosis) in pregnancy ([place])   . Neck pain   . Persistent headaches     Patient Active Problem List   Diagnosis Date Noted  . Pain and swelling of lower leg 11/22/2013  . Varicose veins of lower extremities with other complications 78/29/5621    No past surgical history on file.  OB History    No data available       Home Medications    Prior to Admission medications   Not on File    Family History No family history on file.  Social History Social History  Substance Use Topics  . Smoking status: Never Smoker  . Smokeless tobacco: Never Used  . Alcohol use No     Allergies   Patient has no known allergies.   Review of Systems Review of Systems  Ten systems reviewed and are negative for acute change, except as noted in the HPI.   Physical Exam Updated Vital Signs Pulse 69   Temp 98 F (36.7 C) (Oral)   Resp 16   SpO2 98%   Physical Exam Physical Exam  Nursing note and vitals  reviewed. Constitutional: She is oriented to person, place, and time. She appears well-developed and well-nourished. No distress.  HENT:  Head: Normocephalic and atraumatic.  Eyes: Conjunctivae normal and EOM are normal. Pupils are equal, round, and reactive to light. No scleral icterus.  Neck: Normal range of motion.  Cardiovascular: Normal rate, regular rhythm and normal heart sounds.  Exam reveals no gallop and no friction rub.   No murmur heard. Pulmonary/Chest: Effort normal and breath sounds normal. No respiratory distress.  Abdominal: Soft. Bowel sounds are normal. She exhibits no distension and no mass. There is no tenderness. There is no guarding.  Musculoskeletal: Bruising at the MTP of the second, third and fourth digits of the right foot. Able to wiggle toes, tender to palpation, normal ipsilateral ankle and knee examination. Distal pulses intact.  Neurological: She is alert and oriented to person, place, and time.  Skin: Skin is warm and dry. She is not diaphoretic.     ED Treatments / Results  Labs (all labs ordered are listed, but only abnormal results are displayed) Labs Reviewed - No data to display  EKG  EKG Interpretation None       Radiology No results found.  Procedures Procedures (including critical care time)  Medications Ordered in ED Medications - No data  to display   Patient X-Ray negative for obvious fracture or dislocation. Pain managed in ED. Pt advised to follow up with orthopedics if symptoms persist for possibility of missed fracture diagnosis. Patient given brace while in ED, conservative therapy recommended and discussed. Patient will be dc home & is agreeable with above plan.  Initial Impression / Assessment and Plan / ED Course  I have reviewed the triage vital signs and the nursing notes.  Pertinent labs & imaging results that were available during my care of the patient were reviewed by me and considered in my medical decision making  (see chart for details).       Final Clinical Impressions(s) / ED Diagnoses   Final diagnoses:  Contusion of right foot, initial encounter    New Prescriptions New Prescriptions   No medications on file     Margarita Mail, PA-C 12/23/16 1646    Dorie Rank, MD 12/25/16 [phone]

## 2016-12-23 NOTE — Discharge Instructions (Signed)
Your xray was negative  You appear to have deep bruising of the foot.  Follow up with the orthopedist for any worsening symptoms.  Contact a health care provider if: Your symptoms do not improve after several days of treatment. You have more redness, swelling, or pain in your foot or toes. You have difficulty moving the injured area. Your swelling or pain is not relieved with medicines. Get help right away if: You have severe pain. Your foot or toes become numb. Your foot or toes become pale or cold. You cannot move your foot or ankle. Your foot is warm to the touch.

## 2017-09-17 DIAGNOSIS — Z86718 Personal history of other venous thrombosis and embolism: Secondary | ICD-10-CM | POA: Diagnosis not present

## 2017-09-17 DIAGNOSIS — Z7689 Persons encountering health services in other specified circumstances: Secondary | ICD-10-CM | POA: Diagnosis not present

## 2017-09-17 DIAGNOSIS — Z23 Encounter for immunization: Secondary | ICD-10-CM | POA: Diagnosis not present

## 2017-09-17 DIAGNOSIS — I83893 Varicose veins of bilateral lower extremities with other complications: Secondary | ICD-10-CM | POA: Diagnosis not present

## 2017-09-17 DIAGNOSIS — H1032 Unspecified acute conjunctivitis, left eye: Secondary | ICD-10-CM | POA: Diagnosis not present

## 2017-09-17 DIAGNOSIS — I868 Varicose veins of other specified sites: Secondary | ICD-10-CM | POA: Diagnosis not present

## 2017-09-24 DIAGNOSIS — H1032 Unspecified acute conjunctivitis, left eye: Secondary | ICD-10-CM | POA: Diagnosis not present

## 2017-09-24 DIAGNOSIS — I868 Varicose veins of other specified sites: Secondary | ICD-10-CM | POA: Diagnosis not present

## 2017-09-24 DIAGNOSIS — I83893 Varicose veins of bilateral lower extremities with other complications: Secondary | ICD-10-CM | POA: Diagnosis not present

## 2017-09-24 DIAGNOSIS — Z13220 Encounter for screening for lipoid disorders: Secondary | ICD-10-CM | POA: Diagnosis not present

## 2017-09-24 DIAGNOSIS — N390 Urinary tract infection, site not specified: Secondary | ICD-10-CM | POA: Diagnosis not present

## 2017-09-24 DIAGNOSIS — Z86718 Personal history of other venous thrombosis and embolism: Secondary | ICD-10-CM | POA: Diagnosis not present

## 2017-09-30 DIAGNOSIS — I8312 Varicose veins of left lower extremity with inflammation: Secondary | ICD-10-CM | POA: Diagnosis not present

## 2017-09-30 DIAGNOSIS — I8311 Varicose veins of right lower extremity with inflammation: Secondary | ICD-10-CM | POA: Diagnosis not present

## 2017-09-30 DIAGNOSIS — I83893 Varicose veins of bilateral lower extremities with other complications: Secondary | ICD-10-CM | POA: Diagnosis not present

## 2017-10-01 DIAGNOSIS — Z86718 Personal history of other venous thrombosis and embolism: Secondary | ICD-10-CM | POA: Diagnosis not present

## 2017-10-01 DIAGNOSIS — Z0000 Encounter for general adult medical examination without abnormal findings: Secondary | ICD-10-CM | POA: Diagnosis not present

## 2017-10-01 DIAGNOSIS — Z01419 Encounter for gynecological examination (general) (routine) without abnormal findings: Secondary | ICD-10-CM | POA: Diagnosis not present

## 2017-10-01 DIAGNOSIS — I868 Varicose veins of other specified sites: Secondary | ICD-10-CM | POA: Diagnosis not present

## 2017-10-01 DIAGNOSIS — Z23 Encounter for immunization: Secondary | ICD-10-CM | POA: Diagnosis not present

## 2017-10-01 DIAGNOSIS — I83893 Varicose veins of bilateral lower extremities with other complications: Secondary | ICD-10-CM | POA: Diagnosis not present

## 2017-10-02 DIAGNOSIS — I83893 Varicose veins of bilateral lower extremities with other complications: Secondary | ICD-10-CM | POA: Diagnosis not present

## 2017-10-02 DIAGNOSIS — I8311 Varicose veins of right lower extremity with inflammation: Secondary | ICD-10-CM | POA: Diagnosis not present

## 2017-10-02 DIAGNOSIS — I87023 Postthrombotic syndrome with inflammation of bilateral lower extremity: Secondary | ICD-10-CM | POA: Diagnosis not present

## 2017-10-02 DIAGNOSIS — I8312 Varicose veins of left lower extremity with inflammation: Secondary | ICD-10-CM | POA: Diagnosis not present

## 2017-11-03 DIAGNOSIS — Z1211 Encounter for screening for malignant neoplasm of colon: Secondary | ICD-10-CM | POA: Diagnosis not present

## 2017-11-03 DIAGNOSIS — R740 Nonspecific elevation of levels of transaminase and lactic acid dehydrogenase [LDH]: Secondary | ICD-10-CM | POA: Diagnosis not present

## 2018-02-02 DIAGNOSIS — M25571 Pain in right ankle and joints of right foot: Secondary | ICD-10-CM | POA: Diagnosis not present

## 2018-02-02 DIAGNOSIS — M7989 Other specified soft tissue disorders: Secondary | ICD-10-CM | POA: Diagnosis not present

## 2018-02-02 DIAGNOSIS — M79604 Pain in right leg: Secondary | ICD-10-CM | POA: Diagnosis not present

## 2018-02-10 ENCOUNTER — Ambulatory Visit (INDEPENDENT_AMBULATORY_CARE_PROVIDER_SITE_OTHER): Payer: Medicare HMO | Admitting: Podiatry

## 2018-02-10 ENCOUNTER — Ambulatory Visit (INDEPENDENT_AMBULATORY_CARE_PROVIDER_SITE_OTHER): Payer: Medicare HMO

## 2018-02-10 VITALS — BP 113/78 | HR 71

## 2018-02-10 DIAGNOSIS — M779 Enthesopathy, unspecified: Secondary | ICD-10-CM

## 2018-02-10 DIAGNOSIS — L989 Disorder of the skin and subcutaneous tissue, unspecified: Secondary | ICD-10-CM | POA: Diagnosis not present

## 2018-02-10 DIAGNOSIS — M6740 Ganglion, unspecified site: Secondary | ICD-10-CM | POA: Diagnosis not present

## 2018-02-10 DIAGNOSIS — M76821 Posterior tibial tendinitis, right leg: Secondary | ICD-10-CM | POA: Diagnosis not present

## 2018-02-10 DIAGNOSIS — R600 Localized edema: Secondary | ICD-10-CM

## 2018-02-10 NOTE — Progress Notes (Signed)
    HPI: 67 year old female presenting today as a new patient with a chief complaint of right foot, ankle and leg pain that began several weeks ago. She reports associated swelling and a separate painful nodule noted to the plantar aspect of the right foot. She also notes a painful nodule to the plantar aspect of the left foot. Walking increases the pain. She has not done anything for treatment. Patient is here for further evaluation and treatment.   Past Medical History:  Diagnosis Date  . Ankle swelling   . DVT (deep vein thrombosis) in pregnancy ([place])   . Neck pain   . Persistent headaches        Physical Exam: General: The patient is alert and oriented x3 in no acute distress.  Dermatology: Hyperkeratotic lesions present on the bilateral feet x 2. Pain on palpation with a central nucleated core noted. Skin is warm, dry and supple bilateral lower extremities. Negative for open lesions or macerations.  Vascular: Edema noted to bilateral lower extremities. Palpable pedal pulses bilaterally. Capillary refill within normal limits.  Neurological: Epicritic and protective threshold grossly intact bilaterally.   Musculoskeletal Exam: Pain on palpation noted to the posterior tibial tendon of the right foot. Range of motion within normal limits. Muscle strength 5/5 in all muscle groups bilateral lower extremities.  Radiographic Exam:  Normal osseous mineralization. Joint spaces preserved. No fracture or dislocation identified.    Assessment: 1. Posterior tibial tendinitis right 2. BLE edema  3. 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 Patient was evaluated. Radiographs were reviewed today. 2. Injection of 0.5 mL Celestone Soluspan injected into the posterior tibial tendon sheath.  3. Compression anklets dispensed bilaterally.  4. Excisional debridement of keratoic lesions using a chisel blade was performed without incident.  5. Treated area(s) with Salinocaine  and dressed with light dressing. 6. Return to clinic in 4 weeks.    Edrick Kins, DPM [facility]  Dr. Edrick Kins, [address]                                        [place], [place] [address]                Office [phone]  Fax [phone]

## 2018-03-10 ENCOUNTER — Ambulatory Visit (INDEPENDENT_AMBULATORY_CARE_PROVIDER_SITE_OTHER): Payer: Medicare HMO | Admitting: Podiatry

## 2018-03-10 ENCOUNTER — Telehealth: Payer: Self-pay | Admitting: *Deleted

## 2018-03-10 DIAGNOSIS — M76821 Posterior tibial tendinitis, right leg: Secondary | ICD-10-CM

## 2018-03-10 DIAGNOSIS — R600 Localized edema: Secondary | ICD-10-CM

## 2018-03-10 DIAGNOSIS — R0989 Other specified symptoms and signs involving the circulatory and respiratory systems: Secondary | ICD-10-CM

## 2018-03-10 DIAGNOSIS — I739 Peripheral vascular disease, unspecified: Secondary | ICD-10-CM

## 2018-03-10 NOTE — Patient Instructions (Signed)
For instructions on how to put on your Tri-Lock Ankle Brace, please visit [email or web address] 

## 2018-03-10 NOTE — Progress Notes (Signed)
    HPI: Patient presents today for follow-up treatment evaluation regarding posterior tibial tendinitis to the right lower extremity and bilateral lower extremity edema.  Last visit on 02/10/2018 she received anti-inflammatory injection which she says did not help.  She continues to have pain and tenderness.  She says that she walks 13 flights of stairs to her apartment.  Patient discontinued wearing the compression ankle sleeves bilaterally because she said they were not helping her lower extremity swelling.  She presents for further treatment evaluation  Past Medical History:  Diagnosis Date  . Ankle swelling   . DVT (deep vein thrombosis) in pregnancy ([place])   . Neck pain   . Persistent headaches        Physical Exam: General: The patient is alert and oriented x3 in no acute distress.  Dermatology: Skin is warm, dry and supple bilateral lower extremities. Negative for open lesions or macerations.  Vascular: Edema noted to bilateral lower extremities with hyperpigmentation and possible hemosiderin deposit bilateral foot and ankles.  Today the pulses to the bilateral feet are nonpalpable with extremities that are cold to touch.  Delayed capillary refill.  Neurological: Epicritic and protective threshold grossly intact bilaterally.   Musculoskeletal Exam: Pain on palpation noted to the posterior tibial tendon of the right foot. Range of motion within normal limits. Muscle strength 5/5 in all muscle groups bilateral lower extremities.  Assessment: 1. Posterior tibial tendinitis right 2. BLE edema  3.  Nonpalpable pedal pulses bilateral  4.  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 Patient was evaluated.  2.  Today we are going to order arterial Dopplers to the bilateral lower extremities due to the cold extremities and nonpalpable pedal pulses today.  This may be contributing to the patient's pain 3.  Ankle brace dispensed right lower extremity.  I would  like to avoid if possible putting the patient in a immobilization cam boot.  With the patient's obesity and having to take flights of stairs to her apartment, the immobilization boot would not be tolerable. 4.  Discontinue meloxicam prescribed from patient's PCP due to GI upset.  Resume OTC Motrin 5.  Recommend good supportive shoes.  Patient presents today wearing worn out flats.  Patient says she will go by new good supportive sneakers 6.  Return to clinic in 3 weeks, if there is no improvement we may need to order an MRI to rule out posterior tibial tendon tear  Edrick Kins, DPM [facility]  Dr. Edrick Kins, [address]                                        [place], [place] [address]                Office [phone]  Fax [phone]

## 2018-03-10 NOTE — Telephone Encounter (Signed)
[facility], "THIS [place] MEDICAID MEMBER DOES NOT REQUIRE PRIOR AUTHORIZATION FOR OUTPATIENT RADIOLOGY THROUGH [facility] AT THIS TIME." Faxed orders, Medicaid approval and demographics to [facility].

## 2018-03-10 NOTE — Telephone Encounter (Signed)
-----   Message from Edrick Kins, DPM sent at 03/10/2018 10:32 AM EDT ----- Regarding: Arterial doppler bilateral lower extremities Please order arterial dopplers b/l lower extremities.   Dx: PVD b/l lower extremities  Thanks, Dr. Amalia Hailey

## 2018-03-19 ENCOUNTER — Ambulatory Visit (HOSPITAL_COMMUNITY)
Admission: RE | Admit: 2018-03-19 | Discharge: 2018-03-19 | Disposition: A | Discharge status: Home / Self Care | Payer: Medicare HMO | Source: Ambulatory Visit | Attending: Podiatry | Admitting: Podiatry

## 2018-03-19 ENCOUNTER — Encounter (HOSPITAL_COMMUNITY): Payer: Self-pay

## 2018-03-19 DIAGNOSIS — I739 Peripheral vascular disease, unspecified: Secondary | ICD-10-CM

## 2018-03-19 DIAGNOSIS — R600 Localized edema: Secondary | ICD-10-CM

## 2018-03-19 DIAGNOSIS — R0989 Other specified symptoms and signs involving the circulatory and respiratory systems: Secondary | ICD-10-CM

## 2018-03-29 ENCOUNTER — Ambulatory Visit: Payer: Medicare HMO | Admitting: Podiatry

## 2018-04-05 ENCOUNTER — Ambulatory Visit: Payer: Medicare HMO | Admitting: Podiatry

## 2018-05-03 ENCOUNTER — Ambulatory Visit (INDEPENDENT_AMBULATORY_CARE_PROVIDER_SITE_OTHER): Payer: Medicare HMO | Admitting: Podiatry

## 2018-05-03 DIAGNOSIS — R600 Localized edema: Secondary | ICD-10-CM

## 2018-05-03 DIAGNOSIS — M76821 Posterior tibial tendinitis, right leg: Secondary | ICD-10-CM

## 2018-05-06 NOTE — Progress Notes (Signed)
    HPI: 67 year old female presenting today for follow up evaluation of right foot pain. She states the pain is improving and wearing the brace helps. Walking for long periods of time increases the pain. Patient is here for further evaluation and treatment.   Past Medical History:  Diagnosis Date  . Ankle swelling   . DVT (deep vein thrombosis) in pregnancy ([place])   . Neck pain   . Persistent headaches        Physical Exam: General: The patient is alert and oriented x3 in no acute distress.  Dermatology: Skin is warm, dry and supple bilateral lower extremities. Negative for open lesions or macerations.  Vascular: Chronic edema noted to foot and ankle. Arterial dopplers demonstrate triphasic pattern bilaterally.   Neurological: Epicritic and protective threshold grossly intact bilaterally.   Musculoskeletal Exam: Pain on palpation noted to the posterior tibial tendon of the right foot. Range of motion within normal limits. Muscle strength 5/5 in all muscle groups bilateral lower extremities.  Assessment: 1. Posterior tibial tendinitis right 2. BLE edema   Plan of Care:  1. Patient was evaluated.  2. Continue using ankle brace on right ankle as needed.  3. Continue using compression anklets bilaterally as needed.  4. Recommended good shoe gear.  5. Return to clinic as needed.   Edrick Kins, DPM [facility]  Dr. Edrick Kins, [address]                                        [place], [place] [address]                Office [phone]  Fax [phone]

## 2018-06-22 DIAGNOSIS — R921 Mammographic calcification found on diagnostic imaging of breast: Secondary | ICD-10-CM | POA: Diagnosis not present

## 2018-07-05 DIAGNOSIS — Z86718 Personal history of other venous thrombosis and embolism: Secondary | ICD-10-CM | POA: Diagnosis not present

## 2018-07-05 DIAGNOSIS — R079 Chest pain, unspecified: Secondary | ICD-10-CM | POA: Diagnosis not present

## 2018-07-05 DIAGNOSIS — R05 Cough: Secondary | ICD-10-CM | POA: Diagnosis not present

## 2018-07-05 DIAGNOSIS — J0100 Acute maxillary sinusitis, unspecified: Secondary | ICD-10-CM | POA: Diagnosis not present

## 2018-08-05 DIAGNOSIS — H04123 Dry eye syndrome of bilateral lacrimal glands: Secondary | ICD-10-CM | POA: Diagnosis not present

## 2018-08-05 DIAGNOSIS — R071 Chest pain on breathing: Secondary | ICD-10-CM | POA: Diagnosis not present

## 2018-08-05 DIAGNOSIS — R05 Cough: Secondary | ICD-10-CM | POA: Diagnosis not present

## 2018-08-05 DIAGNOSIS — J0110 Acute frontal sinusitis, unspecified: Secondary | ICD-10-CM | POA: Diagnosis not present

## 2018-09-29 DIAGNOSIS — Z0000 Encounter for general adult medical examination without abnormal findings: Secondary | ICD-10-CM | POA: Diagnosis not present

## 2018-10-05 DIAGNOSIS — Z86718 Personal history of other venous thrombosis and embolism: Secondary | ICD-10-CM | POA: Diagnosis not present

## 2018-10-05 DIAGNOSIS — Z0000 Encounter for general adult medical examination without abnormal findings: Secondary | ICD-10-CM | POA: Diagnosis not present

## 2018-10-05 DIAGNOSIS — K219 Gastro-esophageal reflux disease without esophagitis: Secondary | ICD-10-CM | POA: Diagnosis not present

## 2018-10-05 DIAGNOSIS — R9389 Abnormal findings on diagnostic imaging of other specified body structures: Secondary | ICD-10-CM | POA: Diagnosis not present

## 2018-10-05 DIAGNOSIS — J309 Allergic rhinitis, unspecified: Secondary | ICD-10-CM | POA: Diagnosis not present

## 2018-10-14 ENCOUNTER — Other Ambulatory Visit: Payer: Self-pay | Admitting: Internal Medicine

## 2018-10-14 DIAGNOSIS — R059 Cough, unspecified: Secondary | ICD-10-CM

## 2018-10-14 DIAGNOSIS — R05 Cough: Secondary | ICD-10-CM

## 2018-10-14 DIAGNOSIS — R9389 Abnormal findings on diagnostic imaging of other specified body structures: Secondary | ICD-10-CM

## 2018-12-08 DIAGNOSIS — R9389 Abnormal findings on diagnostic imaging of other specified body structures: Secondary | ICD-10-CM | POA: Diagnosis not present

## 2018-12-08 DIAGNOSIS — R05 Cough: Secondary | ICD-10-CM | POA: Diagnosis not present

## 2018-12-08 DIAGNOSIS — Z7189 Other specified counseling: Secondary | ICD-10-CM | POA: Diagnosis not present

## 2018-12-13 ENCOUNTER — Other Ambulatory Visit: Payer: Self-pay | Admitting: Family Medicine

## 2018-12-13 DIAGNOSIS — R05 Cough: Secondary | ICD-10-CM

## 2018-12-13 DIAGNOSIS — R9389 Abnormal findings on diagnostic imaging of other specified body structures: Secondary | ICD-10-CM

## 2018-12-13 DIAGNOSIS — R053 Chronic cough: Secondary | ICD-10-CM

## 2018-12-24 ENCOUNTER — Ambulatory Visit
Admission: RE | Admit: 2018-12-24 | Discharge: 2018-12-24 | Disposition: A | Discharge status: Home / Self Care | Payer: Medicare HMO | Source: Ambulatory Visit | Attending: Family Medicine | Admitting: Family Medicine

## 2018-12-24 ENCOUNTER — Other Ambulatory Visit: Payer: Self-pay

## 2018-12-24 DIAGNOSIS — R053 Chronic cough: Secondary | ICD-10-CM

## 2018-12-24 DIAGNOSIS — R05 Cough: Secondary | ICD-10-CM

## 2018-12-24 DIAGNOSIS — I700 Atherosclerosis of aorta: Secondary | ICD-10-CM | POA: Diagnosis not present

## 2018-12-24 DIAGNOSIS — R9389 Abnormal findings on diagnostic imaging of other specified body structures: Secondary | ICD-10-CM

## 2018-12-24 MED ORDER — IOPAMIDOL (ISOVUE-300) INJECTION 61%
75.0000 mL | Freq: Once | INTRAVENOUS | Status: AC | PRN
  Administered 2018-12-24: 75 mL via INTRAVENOUS

## 2019-01-18 ENCOUNTER — Encounter: Payer: Self-pay | Admitting: Internal Medicine

## 2019-01-19 ENCOUNTER — Ambulatory Visit (INDEPENDENT_AMBULATORY_CARE_PROVIDER_SITE_OTHER): Payer: Medicare HMO | Admitting: Internal Medicine

## 2019-01-19 ENCOUNTER — Encounter: Payer: Self-pay | Admitting: Internal Medicine

## 2019-01-19 DIAGNOSIS — R05 Cough: Secondary | ICD-10-CM

## 2019-01-19 NOTE — Progress Notes (Signed)
No show

## 2019-08-08 ENCOUNTER — Ambulatory Visit (INDEPENDENT_AMBULATORY_CARE_PROVIDER_SITE_OTHER): Payer: Medicare HMO | Admitting: Podiatry

## 2019-08-08 ENCOUNTER — Other Ambulatory Visit: Payer: Self-pay

## 2019-08-08 ENCOUNTER — Ambulatory Visit (INDEPENDENT_AMBULATORY_CARE_PROVIDER_SITE_OTHER): Payer: Medicare HMO

## 2019-08-08 ENCOUNTER — Other Ambulatory Visit: Payer: Self-pay | Admitting: Podiatry

## 2019-08-08 DIAGNOSIS — M79672 Pain in left foot: Secondary | ICD-10-CM

## 2019-08-08 DIAGNOSIS — D492 Neoplasm of unspecified behavior of bone, soft tissue, and skin: Secondary | ICD-10-CM

## 2019-08-08 DIAGNOSIS — M216X1 Other acquired deformities of right foot: Secondary | ICD-10-CM | POA: Diagnosis not present

## 2019-08-08 DIAGNOSIS — L989 Disorder of the skin and subcutaneous tissue, unspecified: Secondary | ICD-10-CM

## 2019-08-08 DIAGNOSIS — R600 Localized edema: Secondary | ICD-10-CM

## 2019-08-08 DIAGNOSIS — M79671 Pain in right foot: Secondary | ICD-10-CM

## 2019-08-10 NOTE — Progress Notes (Signed)
   Subjective: 69 y.o. female presenting to the office today as a new patient with a chief complaint of aching bilateral plantar foot pain that began 2-3 months ago. Walking increases the pain. She has been using compression anklets for treatment. Patient is here for further evaluation and treatment.   Past Medical History:  Diagnosis Date  . Allergic rhinitis   . DVT (deep venous thrombosis) ([place]) 2008  . GERD (gastroesophageal reflux disease)   . Persistent headaches   . Varicose veins during pregnancy, antepartum      Objective:  Physical Exam General: Alert and oriented x3 in no acute distress  Dermatology: Hyperkeratotic lesion(s) present on the bilateral feet. Pain on palpation with a central nucleated core noted. Skin is warm, dry and supple bilateral lower extremities. Negative for open lesions or macerations.  Vascular: Bilateral lower extremity edema noted. Palpable pedal pulses bilaterally. No erythema noted. Capillary refill within normal limits.  Neurological: Epicritic and protective threshold grossly intact bilaterally.   Musculoskeletal Exam: Pain on palpation at the keratotic lesion(s) noted. Range of motion within normal limits bilateral. Muscle strength 5/5 in all groups bilateral.  Radiographic Exam:  Normal osseous mineralization. Joint spaces preserved. No fracture/dislocation/boney destruction.    Assessment: 1. 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 BLE edema    Plan of Care:  1. Patient evaluated. X-Rays reviewed.  2. Excisional debridement of keratoic lesion(s) using a chisel blade was performed without incident. Salinocaine applied.  3. Dressed area with light dressing. 4. Recommended not going barefoot.  5. Compression anklets dispensed.  6. Patient is to return to the clinic PRN.    Edrick Kins, DPM [facility]  Dr. Edrick Kins, [address]                                        [place], [place]  [address]                Office [phone]  Fax [phone]

## 2019-08-23 ENCOUNTER — Telehealth: Payer: Self-pay | Admitting: Podiatry

## 2019-08-23 NOTE — Telephone Encounter (Signed)
Bi lat callus removed now has a very thin layer of skin and pt states it looks abnormal but wanted some advice to see if she needs to make a appt. Please advise

## 2019-08-23 NOTE — Telephone Encounter (Signed)
I spoke with pt and she states the area of the callous trim now has loose skin over it. I asked pt if it was like a blister, if it had blood or fluid beneath. Pt states she did not notice it when she was putting lotion on but it has become tender. I asked pt if she was in MyChart to send a photo or to send to my email. Pt states she would like to come in, it was now becoming to difficult for the pt. I transferred to the schedulers.

## 2019-08-24 ENCOUNTER — Ambulatory Visit (INDEPENDENT_AMBULATORY_CARE_PROVIDER_SITE_OTHER): Payer: Medicaid Other | Admitting: Podiatry

## 2019-08-24 ENCOUNTER — Other Ambulatory Visit: Payer: Self-pay

## 2019-08-24 DIAGNOSIS — L989 Disorder of the skin and subcutaneous tissue, unspecified: Secondary | ICD-10-CM

## 2019-08-24 DIAGNOSIS — R600 Localized edema: Secondary | ICD-10-CM

## 2019-08-25 DIAGNOSIS — I83893 Varicose veins of bilateral lower extremities with other complications: Secondary | ICD-10-CM | POA: Diagnosis not present

## 2019-08-25 DIAGNOSIS — I8312 Varicose veins of left lower extremity with inflammation: Secondary | ICD-10-CM | POA: Diagnosis not present

## 2019-08-25 DIAGNOSIS — I8311 Varicose veins of right lower extremity with inflammation: Secondary | ICD-10-CM | POA: Diagnosis not present

## 2019-08-26 NOTE — Progress Notes (Signed)
   Subjective: 69 y.o. female presenting to the office today for follow up evaluation of porokeratosis of the bilateral feet as well as bilateral lower extremity edema. She reports tenderness of the calluses and states the swelling is unchanged. She has been using the compression anklets as directed with some relief. There are no worsening factors noted. Patient is here for further evaluation and treatment.   Past Medical History:  Diagnosis Date  . Allergic rhinitis   . DVT (deep venous thrombosis) ([place]) 2008  . GERD (gastroesophageal reflux disease)   . Persistent headaches   . Varicose veins during pregnancy, antepartum      Objective:  Physical Exam General: Alert and oriented x3 in no acute distress  Dermatology: Hyperkeratotic lesion(s) present on the bilateral feet. Pain on palpation with a central nucleated core noted. Skin is warm, dry and supple bilateral lower extremities. Negative for open lesions or macerations.  Vascular: Bilateral lower extremity edema noted. Palpable pedal pulses bilaterally. No erythema noted. Capillary refill within normal limits.  Neurological: Epicritic and protective threshold grossly intact bilaterally.   Musculoskeletal Exam: Pain on palpation at the keratotic lesion(s) noted. Range of motion within normal limits bilateral. Muscle strength 5/5 in all groups bilateral.  Assessment: 1. 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 BLE edema    Plan of Care:  1. Patient evaluated.   2. Excisional debridement of keratoic lesion(s) using a chisel blade was performed without incident. Salinocaine applied.  3. Dressed area with light dressing. 4. Continue using compression anklets.  5. Return to clinic as needed.    Edrick Kins, DPM [facility]  Dr. Edrick Kins, [address]                                        [place], [place] [address]                Office [phone]  Fax [phone]

## 2019-09-08 DIAGNOSIS — I872 Venous insufficiency (chronic) (peripheral): Secondary | ICD-10-CM | POA: Diagnosis not present

## 2019-09-08 DIAGNOSIS — I8311 Varicose veins of right lower extremity with inflammation: Secondary | ICD-10-CM | POA: Diagnosis not present

## 2019-09-08 DIAGNOSIS — I83893 Varicose veins of bilateral lower extremities with other complications: Secondary | ICD-10-CM | POA: Diagnosis not present

## 2019-09-08 DIAGNOSIS — I8312 Varicose veins of left lower extremity with inflammation: Secondary | ICD-10-CM | POA: Diagnosis not present

## 2019-10-13 DIAGNOSIS — Z131 Encounter for screening for diabetes mellitus: Secondary | ICD-10-CM | POA: Diagnosis not present

## 2019-10-13 DIAGNOSIS — Z0000 Encounter for general adult medical examination without abnormal findings: Secondary | ICD-10-CM | POA: Diagnosis not present

## 2019-10-13 DIAGNOSIS — E669 Obesity, unspecified: Secondary | ICD-10-CM | POA: Diagnosis not present

## 2019-10-13 DIAGNOSIS — R05 Cough: Secondary | ICD-10-CM | POA: Diagnosis not present

## 2019-10-13 DIAGNOSIS — I872 Venous insufficiency (chronic) (peripheral): Secondary | ICD-10-CM | POA: Diagnosis not present

## 2019-10-21 DIAGNOSIS — R600 Localized edema: Secondary | ICD-10-CM | POA: Diagnosis not present

## 2019-10-21 DIAGNOSIS — E669 Obesity, unspecified: Secondary | ICD-10-CM | POA: Diagnosis not present

## 2019-10-21 DIAGNOSIS — Z0000 Encounter for general adult medical examination without abnormal findings: Secondary | ICD-10-CM | POA: Diagnosis not present

## 2020-01-12 DIAGNOSIS — I824Z2 Acute embolism and thrombosis of unspecified deep veins of left distal lower extremity: Secondary | ICD-10-CM | POA: Diagnosis not present

## 2020-01-12 DIAGNOSIS — I82412 Acute embolism and thrombosis of left femoral vein: Secondary | ICD-10-CM | POA: Diagnosis not present

## 2020-01-13 DIAGNOSIS — I82412 Acute embolism and thrombosis of left femoral vein: Secondary | ICD-10-CM | POA: Diagnosis not present

## 2020-01-13 DIAGNOSIS — I824Z2 Acute embolism and thrombosis of unspecified deep veins of left distal lower extremity: Secondary | ICD-10-CM | POA: Diagnosis not present

## 2020-01-24 DIAGNOSIS — M5134 Other intervertebral disc degeneration, thoracic region: Secondary | ICD-10-CM | POA: Diagnosis not present

## 2020-01-24 DIAGNOSIS — M5136 Other intervertebral disc degeneration, lumbar region: Secondary | ICD-10-CM | POA: Diagnosis not present

## 2020-01-24 DIAGNOSIS — M47814 Spondylosis without myelopathy or radiculopathy, thoracic region: Secondary | ICD-10-CM | POA: Diagnosis not present

## 2020-01-24 DIAGNOSIS — M160 Bilateral primary osteoarthritis of hip: Secondary | ICD-10-CM | POA: Diagnosis not present

## 2020-01-24 DIAGNOSIS — M47816 Spondylosis without myelopathy or radiculopathy, lumbar region: Secondary | ICD-10-CM | POA: Diagnosis not present

## 2020-01-24 DIAGNOSIS — I83893 Varicose veins of bilateral lower extremities with other complications: Secondary | ICD-10-CM | POA: Diagnosis not present

## 2020-01-24 DIAGNOSIS — R109 Unspecified abdominal pain: Secondary | ICD-10-CM | POA: Diagnosis not present

## 2020-02-28 DIAGNOSIS — I82412 Acute embolism and thrombosis of left femoral vein: Secondary | ICD-10-CM | POA: Diagnosis not present

## 2020-02-28 DIAGNOSIS — I824Z2 Acute embolism and thrombosis of unspecified deep veins of left distal lower extremity: Secondary | ICD-10-CM | POA: Diagnosis not present

## 2020-03-22 DIAGNOSIS — Z1231 Encounter for screening mammogram for malignant neoplasm of breast: Secondary | ICD-10-CM | POA: Diagnosis not present

## 2020-03-28 DIAGNOSIS — M5440 Lumbago with sciatica, unspecified side: Secondary | ICD-10-CM | POA: Diagnosis not present

## 2020-06-05 ENCOUNTER — Ambulatory Visit: Payer: Medicare HMO | Admitting: Podiatry

## 2020-06-11 ENCOUNTER — Ambulatory Visit: Payer: Medicare HMO | Admitting: Podiatry

## 2020-06-13 ENCOUNTER — Ambulatory Visit (INDEPENDENT_AMBULATORY_CARE_PROVIDER_SITE_OTHER): Payer: Medicare HMO | Admitting: Podiatry

## 2020-06-13 ENCOUNTER — Other Ambulatory Visit: Payer: Self-pay

## 2020-06-13 DIAGNOSIS — L989 Disorder of the skin and subcutaneous tissue, unspecified: Secondary | ICD-10-CM | POA: Diagnosis not present

## 2020-06-13 NOTE — Progress Notes (Signed)
   Subjective: 69 y.o. female presenting to the office today for follow up evaluation of porokeratosis of the bilateral feet as well as bilateral lower extremity edema. She reports tenderness of the calluses and states the swelling is unchanged. She has been using the compression anklets as directed with some relief. There are no worsening factors noted. Patient is here for further evaluation and treatment.   Past Medical History:  Diagnosis Date  . Allergic rhinitis   . DVT (deep venous thrombosis) ([facility]) 2008  . GERD (gastroesophageal reflux disease)   . Persistent headaches   . Varicose veins during pregnancy, antepartum      Objective:  Physical Exam General: Alert and oriented x3 in no acute distress  Dermatology: Hyperkeratotic lesion(s) present on the bilateral feet. Pain on palpation with a central nucleated core noted. Skin is warm, dry and supple bilateral lower extremities. Negative for open lesions or macerations.  Vascular: Bilateral lower extremity edema noted. Palpable pedal pulses bilaterally. No erythema noted. Capillary refill within normal limits.  Neurological: Epicritic and protective threshold grossly intact bilaterally.   Musculoskeletal Exam: Pain on palpation at the keratotic lesion(s) noted. Range of motion within normal limits bilateral. Muscle strength 5/5 in all groups bilateral.  Assessment: 1. 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 BLE edema    Plan of Care:  1. Patient evaluated.   2. Excisional debridement of keratoic lesion(s) using a chisel blade was performed without incident. Salinocaine applied.  3. Dressed area with light dressing. 4. Continue using compression anklets.  5. Return to clinic as needed.    Brent M. Evans, DPM [facility]  Dr. Brent M. Evans, DPM    [address]                                        [place], [place] [address]                Office [phone]  Fax [phone]     

## 2020-06-14 ENCOUNTER — Ambulatory Visit: Payer: Medicare HMO | Admitting: Podiatry

## 2020-08-02 DIAGNOSIS — I8002 Phlebitis and thrombophlebitis of superficial vessels of left lower extremity: Secondary | ICD-10-CM | POA: Diagnosis not present

## 2020-08-02 DIAGNOSIS — I824Z2 Acute embolism and thrombosis of unspecified deep veins of left distal lower extremity: Secondary | ICD-10-CM | POA: Diagnosis not present

## 2020-08-08 ENCOUNTER — Ambulatory Visit (INDEPENDENT_AMBULATORY_CARE_PROVIDER_SITE_OTHER): Payer: Medicare HMO | Admitting: Podiatry

## 2020-08-08 ENCOUNTER — Telehealth: Payer: Self-pay | Admitting: *Deleted

## 2020-08-08 ENCOUNTER — Other Ambulatory Visit: Payer: Self-pay

## 2020-08-08 DIAGNOSIS — B070 Plantar wart: Secondary | ICD-10-CM | POA: Diagnosis not present

## 2020-08-08 MED ORDER — TRAMADOL HCL 50 MG PO TABS: 50.0000 mg | ORAL_TABLET | ORAL | 0 refills | Status: DC | PRN

## 2020-08-08 NOTE — Addendum Note (Signed)
Addended by: Edrick Kins on: 08/08/2020 05:55 PM   Modules accepted: Orders

## 2020-08-08 NOTE — Telephone Encounter (Signed)
Patient is calling to confirm a prescription that was suppose to be called into her pharmacy mentioned during her visit today. Please advise.

## 2020-08-08 NOTE — Telephone Encounter (Signed)
Rx sent. - Dr. Niam Nepomuceno

## 2020-08-08 NOTE — Progress Notes (Addendum)
   Subjective: 70 y.o. female presenting for follow-up evaluation of pain and tenderness secondary to skin lesions to the bilateral feet.  Patient states that they continue to be painful and tender despite debriding the lesions.  She presents for further treatment evaluation    Past Medical History:  Diagnosis Date  . Allergic rhinitis   . DVT (deep venous thrombosis) ([facility]) 2008  . GERD (gastroesophageal reflux disease)   . Persistent headaches   . Varicose veins during pregnancy, antepartum     Objective: Physical Exam General: The patient is alert and oriented x3 in no acute distress.   Dermatology: Hyperkeratotic skin lesions noted to the plantar aspect of the bilateral feet approximately 1 cm in diameter. Pinpoint bleeding noted upon debridement. Skin is warm, dry and supple bilateral lower extremities. Negative for open lesions or macerations.   Vascular: Palpable pedal pulses bilaterally. No edema or erythema noted. Capillary refill within normal limits.   Neurological: Epicritic and protective threshold grossly intact bilaterally.    Musculoskeletal Exam: Pain on palpation to the noted skin lesions.  Range of motion within normal limits to all pedal and ankle joints bilateral. Muscle strength 5/5 in all groups bilateral.    Assessment: 1. plantar wart bilateral feet    Plan of Care:  1. Patient was evaluated. 2. Excisional debridement of the plantar wart lesion(s) was performed using a chisel blade. Cantharone was applied and the lesion(s) was dressed with a dry sterile dressing.  Post care instructions provided 3. Rx Tramadol 50mg  PRN pain 4.  patient is to return to clinic in 2 weeks  Edrick Kins, DPM [facility]  Dr. Edrick Kins, DPM    [address], [place] [address]                Office [phone]  Fax [phone]

## 2020-08-09 NOTE — Telephone Encounter (Signed)
Called and informed patient that prescription has been sent to pharmacy. She said that the wrong pharmacy was listed in her chart, changed to correct pharmacy.

## 2020-08-17 DIAGNOSIS — I82412 Acute embolism and thrombosis of left femoral vein: Secondary | ICD-10-CM | POA: Diagnosis not present

## 2020-08-17 DIAGNOSIS — I8312 Varicose veins of left lower extremity with inflammation: Secondary | ICD-10-CM | POA: Diagnosis not present

## 2020-08-17 DIAGNOSIS — I8311 Varicose veins of right lower extremity with inflammation: Secondary | ICD-10-CM | POA: Diagnosis not present

## 2020-08-17 DIAGNOSIS — I8002 Phlebitis and thrombophlebitis of superficial vessels of left lower extremity: Secondary | ICD-10-CM | POA: Diagnosis not present

## 2020-08-22 ENCOUNTER — Other Ambulatory Visit: Payer: Self-pay

## 2020-08-22 ENCOUNTER — Telehealth: Payer: Self-pay | Admitting: Podiatry

## 2020-08-22 ENCOUNTER — Telehealth: Payer: Self-pay | Admitting: *Deleted

## 2020-08-22 ENCOUNTER — Ambulatory Visit (INDEPENDENT_AMBULATORY_CARE_PROVIDER_SITE_OTHER): Payer: Medicare HMO | Admitting: Podiatry

## 2020-08-22 DIAGNOSIS — B070 Plantar wart: Secondary | ICD-10-CM

## 2020-08-22 DIAGNOSIS — M79672 Pain in left foot: Secondary | ICD-10-CM

## 2020-08-22 DIAGNOSIS — M79671 Pain in right foot: Secondary | ICD-10-CM

## 2020-08-22 MED ORDER — TRAMADOL HCL 50 MG PO TABS: 50.0000 mg | ORAL_TABLET | Freq: Three times a day (TID) | ORAL | 0 refills | Status: DC | PRN

## 2020-08-22 NOTE — Telephone Encounter (Signed)
Patient called expressing concerns for possible allergic reaction, stated she is feeling a burning sensation with feet and unsure how to soothe. Called earlier requesting meds but stated at this point she don't think that will help because something Is wrong, Please Advise

## 2020-08-22 NOTE — Telephone Encounter (Signed)
Pamela Flores called and she is having a lot of pain/burning sensation from appointment today. She never picked up pain  medication that was originally sent, so she was wondering if you could rewrite the RX and send it to the [facility] [address] in [place]. She would also like for someone to call her to discuss symptoms, that she is having.

## 2020-08-22 NOTE — Telephone Encounter (Signed)
Patient has been calling and she would a resolution to this ASAP. She was in office today and a cream was applied to her calluses which is causing a possible allergic reaction to her feet. They feel as though they are fire, pain is excruciating, cannot rest on any surface at all and not able to walk.Please advise.

## 2020-08-22 NOTE — Progress Notes (Signed)
   Subjective: 70 y.o. female presenting for follow-up evaluation of pain and tenderness secondary to skin lesions to the bilateral feet.  Patient continues to have some tenderness especially to the left foot.  The right foot is significantly improved.  She presents for follow-up treatment evaluation   Past Medical History:  Diagnosis Date  . Allergic rhinitis   . DVT (deep venous thrombosis) ([facility]) 2008  . GERD (gastroesophageal reflux disease)   . Persistent headaches   . Varicose veins during pregnancy, antepartum     Objective: Physical Exam General: The patient is alert and oriented x3 in no acute distress.   Dermatology: Hyperkeratotic skin lesions noted to the plantar aspect of the bilateral feet approximately 1 cm in diameter. Pinpoint bleeding noted upon debridement. Skin is warm, dry and supple bilateral lower extremities. Negative for open lesions or macerations.   Vascular: Palpable pedal pulses bilaterally. No edema or erythema noted. Capillary refill within normal limits.   Neurological: Epicritic and protective threshold grossly intact bilaterally.    Musculoskeletal Exam: Pain on palpation to the noted skin lesions.  Range of motion within normal limits to all pedal and ankle joints bilateral. Muscle strength 5/5 in all groups bilateral.    Assessment: 1. plantar wart bilateral feet    Plan of Care:  1. Patient was evaluated. 2. Excisional debridement of the plantar wart lesion(s) was performed using a chisel blade. Cantharone was reapplied today and the lesion(s) was dressed with a dry sterile dressing.  Post care instructions provided 3. Continue Tramadol 50mg  PRN pain 4.  patient is to return to clinic in 2 weeks  Edrick Kins, DPM [facility]  Dr. Edrick Kins, DPM    [address], [place] [address]                Office [phone]  Fax [phone]

## 2020-08-23 ENCOUNTER — Other Ambulatory Visit: Payer: Self-pay | Admitting: Podiatry

## 2020-08-23 MED ORDER — TRAMADOL HCL 50 MG PO TABS: 50.0000 mg | ORAL_TABLET | ORAL | 0 refills | Status: AC | PRN

## 2020-09-05 ENCOUNTER — Other Ambulatory Visit: Payer: Self-pay

## 2020-09-05 ENCOUNTER — Ambulatory Visit (INDEPENDENT_AMBULATORY_CARE_PROVIDER_SITE_OTHER): Payer: Medicare HMO | Admitting: Podiatry

## 2020-09-05 DIAGNOSIS — B070 Plantar wart: Secondary | ICD-10-CM

## 2020-09-05 NOTE — Progress Notes (Signed)
   Subjective: 70 y.o. female presenting for follow-up evaluation of pain and tenderness secondary to skin lesions to the bilateral feet.  Patient states that the pain from the [facility] was excruciating for about 4 days.  She states that the tramadol was sent to the wrong pharmacy.  She presents for further treatment and evaluation   Past Medical History:  Diagnosis Date  . Allergic rhinitis   . DVT (deep venous thrombosis) ([facility]) 2008  . GERD (gastroesophageal reflux disease)   . Persistent headaches   . Varicose veins during pregnancy, antepartum     Objective: Physical Exam General: The patient is alert and oriented x3 in no acute distress.   Dermatology: Hyperkeratotic skin lesions noted to the plantar aspect of the bilateral feet approximately 1 cm in diameter. Pinpoint bleeding noted upon debridement. Skin is warm, dry and supple bilateral lower extremities. Negative for open lesions or macerations.   Vascular: Palpable pedal pulses bilaterally. No edema or erythema noted. Capillary refill within normal limits.   Neurological: Epicritic and protective threshold grossly intact bilaterally.    Musculoskeletal Exam:  Significantly improved pain on palpation to the noted skin lesions.  Range of motion within normal limits to all pedal and ankle joints bilateral. Muscle strength 5/5 in all groups bilateral.    Assessment: 1. plantar wart bilateral feet    Plan of Care:  1. Patient was evaluated. 2. Excisional debridement of the plantar wart lesion(s) was performed using a chisel blade.  Salicylic acid was reapplied today and the lesion(s) was dressed with a dry sterile dressing.  Post care instructions provided 3.  Recommend OTC salicylic acid daily S16 days 4.  Return to clinic as needed  Edrick Kins, DPM [facility]  Dr. Edrick Kins, DPM    [address], [place] [address]                Office [phone]   Fax [phone]

## 2020-09-06 ENCOUNTER — Telehealth: Payer: Self-pay

## 2020-09-06 ENCOUNTER — Telehealth: Payer: Self-pay | Admitting: Podiatry

## 2020-09-06 NOTE — Telephone Encounter (Signed)
Pt was in the order on Wednesday to see Dr. Amalia Hailey. Some medication was applied to her feet. The feet are now burning and its very painful. She was insured that she would not have this type of reaction. Pain, swelling and burning is getting worst. Please advise.

## 2020-09-06 NOTE — Telephone Encounter (Signed)
Patient called in stating she is currently having allergic reaction to the chemicals that were used during procedure yesterday, stated she declined After Visit Summary and is unsure of exactly what was performed during visit, Please Advise

## 2020-09-07 ENCOUNTER — Ambulatory Visit: Payer: Medicare HMO | Admitting: Podiatry

## 2020-09-07 NOTE — Telephone Encounter (Signed)
All I applied was salinocaine. She can wash it off. - Dr. Amalia Hailey

## 2020-09-28 DIAGNOSIS — I824Z2 Acute embolism and thrombosis of unspecified deep veins of left distal lower extremity: Secondary | ICD-10-CM | POA: Diagnosis not present

## 2020-09-28 DIAGNOSIS — I8003 Phlebitis and thrombophlebitis of superficial vessels of lower extremities, bilateral: Secondary | ICD-10-CM | POA: Diagnosis not present

## 2020-09-28 DIAGNOSIS — I82412 Acute embolism and thrombosis of left femoral vein: Secondary | ICD-10-CM | POA: Diagnosis not present

## 2020-10-26 DIAGNOSIS — R7309 Other abnormal glucose: Secondary | ICD-10-CM | POA: Diagnosis not present

## 2020-10-26 DIAGNOSIS — M5134 Other intervertebral disc degeneration, thoracic region: Secondary | ICD-10-CM | POA: Diagnosis not present

## 2020-10-26 DIAGNOSIS — R946 Abnormal results of thyroid function studies: Secondary | ICD-10-CM | POA: Diagnosis not present

## 2020-10-26 DIAGNOSIS — Z0000 Encounter for general adult medical examination without abnormal findings: Secondary | ICD-10-CM | POA: Diagnosis not present

## 2020-10-30 ENCOUNTER — Other Ambulatory Visit: Payer: Self-pay | Admitting: Family Medicine

## 2020-10-30 DIAGNOSIS — M5134 Other intervertebral disc degeneration, thoracic region: Secondary | ICD-10-CM

## 2020-10-30 DIAGNOSIS — M546 Pain in thoracic spine: Secondary | ICD-10-CM

## 2020-12-12 ENCOUNTER — Ambulatory Visit (INDEPENDENT_AMBULATORY_CARE_PROVIDER_SITE_OTHER): Payer: Medicare HMO | Admitting: Podiatry

## 2020-12-12 ENCOUNTER — Other Ambulatory Visit: Payer: Self-pay

## 2020-12-12 DIAGNOSIS — B070 Plantar wart: Secondary | ICD-10-CM

## 2020-12-12 NOTE — Progress Notes (Signed)
   HPI: 70 y.o. female presenting today for follow-up evaluation of plantar verruca to the bilateral feet.  Patient states that she is feeling much better.  She has no pain associated to the area.  She presents for follow-up treatment and evaluation  Past Medical History:  Diagnosis Date   Allergic rhinitis    DVT (deep venous thrombosis) ([facility]) 2008   GERD (gastroesophageal reflux disease)    Persistent headaches    Varicose veins during pregnancy, antepartum      Physical Exam: General: The patient is alert and oriented x3 in no acute distress.  Dermatology: Skin is warm, dry and supple bilateral lower extremities. Negative for open lesions or macerations.  There are some slight hyperkeratosis of the skin to the subfifth MTPJ of the bilateral feet.  Vascular: Palpable pedal pulses bilaterally. No edema or erythema noted. Capillary refill within normal limits.  Neurological: Epicritic and protective threshold grossly intact bilaterally.   Musculoskeletal Exam: No pedal deformities noted   Assessment: 1.  Preulcerative callus lesions subfifth MTPJ bilateral   Plan of Care:  1. Patient evaluated.  2.  Light debridement performed without incident or bleeding using a 312 scalpel 3.  Recommend good supportive shoes that support the feet 4.  Return to clinic as needed      Edrick Kins, DPM [facility]  Dr. Edrick Kins, DPM    [address], [place] [address]                Office [phone]  Fax [phone]

## 2021-01-18 DIAGNOSIS — M5136 Other intervertebral disc degeneration, lumbar region: Secondary | ICD-10-CM | POA: Diagnosis not present

## 2021-01-18 DIAGNOSIS — E631 Imbalance of constituents of food intake: Secondary | ICD-10-CM | POA: Diagnosis not present

## 2021-01-18 DIAGNOSIS — R7309 Other abnormal glucose: Secondary | ICD-10-CM | POA: Diagnosis not present

## 2021-01-18 DIAGNOSIS — R946 Abnormal results of thyroid function studies: Secondary | ICD-10-CM | POA: Diagnosis not present

## 2021-01-18 DIAGNOSIS — Z0000 Encounter for general adult medical examination without abnormal findings: Secondary | ICD-10-CM | POA: Diagnosis not present

## 2021-01-22 DIAGNOSIS — M5136 Other intervertebral disc degeneration, lumbar region: Secondary | ICD-10-CM | POA: Diagnosis not present

## 2021-01-22 DIAGNOSIS — M549 Dorsalgia, unspecified: Secondary | ICD-10-CM | POA: Diagnosis not present

## 2021-01-22 DIAGNOSIS — M546 Pain in thoracic spine: Secondary | ICD-10-CM | POA: Diagnosis not present

## 2021-01-22 DIAGNOSIS — M898X8 Other specified disorders of bone, other site: Secondary | ICD-10-CM | POA: Diagnosis not present

## 2021-01-28 ENCOUNTER — Other Ambulatory Visit: Payer: Self-pay | Admitting: Family Medicine

## 2021-01-28 DIAGNOSIS — M898X8 Other specified disorders of bone, other site: Secondary | ICD-10-CM

## 2021-01-28 DIAGNOSIS — M5136 Other intervertebral disc degeneration, lumbar region: Secondary | ICD-10-CM

## 2021-01-28 DIAGNOSIS — M546 Pain in thoracic spine: Secondary | ICD-10-CM

## 2021-01-28 DIAGNOSIS — M5134 Other intervertebral disc degeneration, thoracic region: Secondary | ICD-10-CM

## 2021-01-28 DIAGNOSIS — M545 Low back pain, unspecified: Secondary | ICD-10-CM

## 2021-02-12 ENCOUNTER — Ambulatory Visit
Admission: RE | Admit: 2021-02-12 | Discharge: 2021-02-12 | Disposition: A | Discharge status: Home / Self Care | Payer: Medicare HMO | Source: Ambulatory Visit | Attending: Family Medicine | Admitting: Family Medicine

## 2021-02-12 DIAGNOSIS — M5134 Other intervertebral disc degeneration, thoracic region: Secondary | ICD-10-CM

## 2021-02-12 DIAGNOSIS — M5136 Other intervertebral disc degeneration, lumbar region: Secondary | ICD-10-CM

## 2021-02-12 DIAGNOSIS — M898X8 Other specified disorders of bone, other site: Secondary | ICD-10-CM

## 2021-02-12 DIAGNOSIS — M545 Low back pain, unspecified: Secondary | ICD-10-CM | POA: Diagnosis not present

## 2021-02-12 DIAGNOSIS — M546 Pain in thoracic spine: Secondary | ICD-10-CM

## 2021-02-28 DIAGNOSIS — M545 Low back pain, unspecified: Secondary | ICD-10-CM | POA: Diagnosis not present

## 2021-03-06 DIAGNOSIS — M545 Low back pain, unspecified: Secondary | ICD-10-CM | POA: Diagnosis not present

## 2021-03-08 DIAGNOSIS — M545 Low back pain, unspecified: Secondary | ICD-10-CM | POA: Diagnosis not present

## 2021-03-11 DIAGNOSIS — M545 Low back pain, unspecified: Secondary | ICD-10-CM | POA: Diagnosis not present

## 2021-03-13 DIAGNOSIS — M545 Low back pain, unspecified: Secondary | ICD-10-CM | POA: Diagnosis not present

## 2021-03-28 DIAGNOSIS — Z1231 Encounter for screening mammogram for malignant neoplasm of breast: Secondary | ICD-10-CM | POA: Diagnosis not present

## 2021-04-08 ENCOUNTER — Ambulatory Visit: Payer: Medicare HMO | Admitting: Podiatry

## 2021-04-15 ENCOUNTER — Other Ambulatory Visit: Payer: Self-pay

## 2021-04-15 ENCOUNTER — Ambulatory Visit (INDEPENDENT_AMBULATORY_CARE_PROVIDER_SITE_OTHER): Payer: Medicare HMO | Admitting: Podiatry

## 2021-04-15 DIAGNOSIS — L989 Disorder of the skin and subcutaneous tissue, unspecified: Secondary | ICD-10-CM | POA: Diagnosis not present

## 2021-04-15 NOTE — Progress Notes (Signed)
   Subjective: 70 y.o. female presenting to the office today for evaluation of symptomatic calluses to the bilateral feet.  Patient admits to walking barefoot around the house significantly throughout the day.  She has not done anything for treatment.  She presents for further treatment and evaluation   Past Medical History:  Diagnosis Date   Allergic rhinitis    DVT (deep venous thrombosis) ([place]) 2008   GERD (gastroesophageal reflux disease)    Persistent headaches    Varicose veins during pregnancy, antepartum      Objective:  Physical Exam General: Alert and oriented x3 in no acute distress  Dermatology: Hyperkeratotic lesion(s) present on the plantar aspect fifth MTP joint. Pain on palpation with a central nucleated core noted. Skin is warm, dry and supple bilateral lower extremities. Negative for open lesions or macerations.  Vascular: Palpable pedal pulses bilaterally. No edema or erythema noted. Capillary refill within normal limits.  Neurological: Epicritic and protective threshold grossly intact bilaterally.   Musculoskeletal Exam: Pain on palpation at the keratotic lesion(s) noted. Range of motion within normal limits bilateral. Muscle strength 5/5 in all groups bilateral.  Assessment: 1.  Preulcerative callus lesions plantar aspect fifth MTP joint bilateral   Plan of Care:  1. Patient evaluated.  Advised against going barefoot.  Recommend good supportive slippers and arch supports to alleviate pressure from the forefoot 2. Excisional debridement of keratoic lesion(s) using a chisel blade was performed without incident.  3. Dressed area with light dressing. 4. Patient is to return to the clinic PRN.   Edrick Kins, DPM [facility]  Dr. Edrick Kins, DPM    [address], [place] [address]                Office [phone]  Fax [phone]

## 2021-05-08 DIAGNOSIS — Z1211 Encounter for screening for malignant neoplasm of colon: Secondary | ICD-10-CM | POA: Diagnosis not present

## 2021-05-08 DIAGNOSIS — R748 Abnormal levels of other serum enzymes: Secondary | ICD-10-CM | POA: Diagnosis not present

## 2021-05-27 ENCOUNTER — Ambulatory Visit: Payer: Medicare HMO | Admitting: Podiatry

## 2021-05-29 ENCOUNTER — Ambulatory Visit: Payer: Medicare HMO | Admitting: Podiatry

## 2021-06-17 ENCOUNTER — Ambulatory Visit (INDEPENDENT_AMBULATORY_CARE_PROVIDER_SITE_OTHER): Payer: Medicare HMO

## 2021-06-17 ENCOUNTER — Ambulatory Visit (INDEPENDENT_AMBULATORY_CARE_PROVIDER_SITE_OTHER): Payer: Medicare HMO | Admitting: Podiatry

## 2021-06-17 ENCOUNTER — Other Ambulatory Visit: Payer: Self-pay

## 2021-06-17 DIAGNOSIS — L989 Disorder of the skin and subcutaneous tissue, unspecified: Secondary | ICD-10-CM

## 2021-06-17 DIAGNOSIS — M7752 Other enthesopathy of left foot: Secondary | ICD-10-CM | POA: Diagnosis not present

## 2021-06-17 DIAGNOSIS — M778 Other enthesopathies, not elsewhere classified: Secondary | ICD-10-CM

## 2021-06-17 DIAGNOSIS — R600 Localized edema: Secondary | ICD-10-CM

## 2021-06-17 NOTE — Progress Notes (Signed)
° °  Subjective: 71 y.o. female presenting to the office today for follow-up evaluation of symptomatic calluses to the plantar aspect of the bilateral forefoot.  Patient states that it is very painful and tender.  She did not get much relief last visit with the debridement.  She states that she was thinking yesterday that she may need orthotics to help support the arches and alleviate pressure from the calluses that keep recurring  Past Medical History:  Diagnosis Date   Allergic rhinitis    DVT (deep venous thrombosis) ([facility]) 2008   GERD (gastroesophageal reflux disease)    Persistent headaches    Varicose veins during pregnancy, antepartum    Past Surgical History:  Procedure Laterality Date   none     No Known Allergies  Objective:  Physical Exam General: Alert and oriented x3 in no acute distress  Dermatology: Hyperkeratotic lesion(s) present on the plantar aspect fifth MTP joint. Pain on palpation with a central nucleated core noted. Skin is warm, dry and supple bilateral lower extremities. Negative for open lesions or macerations.  Vascular: Palpable pedal pulses bilaterally. No edema or erythema noted. Capillary refill within normal limits.  Neurological: Epicritic and protective threshold grossly intact bilaterally.   Musculoskeletal Exam: Pain on palpation at the keratotic lesion(s) noted. Range of motion within normal limits bilateral.  Today there is also pain on palpation throughout the right midtarsal joint and midfoot muscle strength 5/5 in all groups bilateral.  Radiographic exam B/L feet: Normal osseous mineralization.  Joint spaces mostly preserved.  No acute fracture identified.  No osseous abnormality.  Otherwise normal exam  Assessment: 1.  Symptomatic preulcerative callus lesions plantar aspect fifth MTP joint bilateral 2.  Capsulitis right midfoot   Plan of Care:  1. Patient evaluated.  X-rays reviewed.  Patient states that she does not go barefoot. 2.  Today  we discussed the possibility of orthotics for the patient.  Orthotics may help alleviate pressure from the forefoot and offload the fifth MTP area as well as support the arch of the right foot to alleviate the patient's capsulitis and joint pain 3.  Appointment with Pedorthist for custom molded orthotics 4.  Excisional debridement of the preulcerative callus lesions was performed using a 312 scalpel without incident or bleeding 5.  Return to clinic as needed  Edrick Kins, DPM [facility]  Dr. Edrick Kins, DPM    [address], [place] [address]                Office [phone]  Fax [phone]

## 2021-06-18 ENCOUNTER — Other Ambulatory Visit: Payer: Self-pay | Admitting: Podiatry

## 2021-06-18 DIAGNOSIS — M778 Other enthesopathies, not elsewhere classified: Secondary | ICD-10-CM

## 2021-06-24 ENCOUNTER — Other Ambulatory Visit: Payer: Medicare HMO

## 2021-07-13 ENCOUNTER — Other Ambulatory Visit: Payer: Self-pay

## 2021-07-13 ENCOUNTER — Encounter (HOSPITAL_COMMUNITY): Payer: Self-pay | Admitting: Emergency Medicine

## 2021-07-13 ENCOUNTER — Emergency Department (HOSPITAL_COMMUNITY)
Admission: EM | Admit: 2021-07-13 | Discharge: 2021-07-13 | Disposition: A | Discharge status: Home / Self Care | Payer: Medicare HMO | Attending: Emergency Medicine | Admitting: Emergency Medicine

## 2021-07-13 DIAGNOSIS — K0889 Other specified disorders of teeth and supporting structures: Secondary | ICD-10-CM | POA: Diagnosis not present

## 2021-07-13 DIAGNOSIS — Z7901 Long term (current) use of anticoagulants: Secondary | ICD-10-CM | POA: Diagnosis not present

## 2021-07-13 MED ORDER — OXYCODONE HCL 5 MG PO TABS: 5.0000 mg | ORAL_TABLET | Freq: Four times a day (QID) | ORAL | 0 refills | Status: DC | PRN

## 2021-07-13 NOTE — ED Provider Notes (Signed)
[facility] EMERGENCY DEPARTMENT Provider Note   CSN: [id number] Arrival date & time: 07/13/21  1807     History  Chief Complaint  Patient presents with   Dental Pain    Pamela Flores is a 71 y.o. female presenting with right upper dental pain.  She reports that this has been going on for almost a week.  She broke her right upper molar while eating and has been treating her pain with ibuprofen which is not working.  She is unable to see a dentist until Tuesday and she is in such severe pain she feels as though she might pass out.  No trouble swallowing or breathing.  No fevers or chills    Home Medications Prior to Admission medications   Medication Sig Start Date End Date Taking? Authorizing Provider  albuterol (VENTOLIN HFA) 108 (90 Base) MCG/ACT inhaler 2 puffs 4x per day as needed. as needed 06/06/10   [provider]  apixaban (ELIQUIS) 5 MG TABS tablet See admin instructions.    [provider]  Carboxymethylcellulose Sodium 1 % GEL 1 drop to each eye, okay for generic/otc 08/05/18   [provider]  Cholecalciferol 25 MCG (1000 UT) capsule Take by mouth. 08/24/07   [provider]  cyclobenzaprine (FLEXERIL) 5 MG tablet 1 tablet 03/28/20   [provider]  FERROUS SULFATE PO Take 1 tablet by mouth daily. 08/24/07   [provider]  fluticasone Asencion Islam) 50 MCG/ACT nasal spray 2 sprays in each nostril 08/05/18   [provider]  meloxicam (MOBIC) 15 MG tablet 1 tablet 08/05/18   [provider]  omeprazole (PRILOSEC) 20 MG capsule 1 cap by mouth daily before eating dinner 08/24/07   [provider]  polyethylene glycol-electrolytes (NULYTELY/GOLYTELY) 420 g solution See admin instructions. 03/19/18   [provider]  promethazine-codeine (PHENERGAN WITH CODEINE) 6.25-10 MG/5ML syrup 5-10 ml by mouth 4 times a day as needed for cough 06/06/10   [provider]   trimethoprim-polymyxin b (POLYTRIM) ophthalmic solution 1 drop into affected eye    [provider]      Allergies    Patient has no known allergies.    Review of Systems   Review of Systems See HPI  Physical Exam Updated Vital Signs BP (!) 162/98    Pulse 73    Temp 98.5 F (36.9 C) (Oral)    Resp 18    SpO2 98%  Physical Exam Vitals and nursing note reviewed.  Constitutional:      Appearance: Normal appearance.  HENT:     Head: Normocephalic and atraumatic.     Mouth/Throat:     Mouth: Mucous membranes are moist.     Pharynx: Oropharynx is clear.      Comments: Patient's second right upper molar is broken off.  No surrounding gingivitis or pulpitis.  The same area on the left upper palate has severely decaying teeth.  No gingivitis, pulpitis or signs of abscesses.  Airway clear and tolerating secretions. Eyes:     General: No scleral icterus.    Conjunctiva/sclera: Conjunctivae normal.  Pulmonary:     Effort: Pulmonary effort is normal. No respiratory distress.  Skin:    Findings: No rash.  Neurological:     Mental Status: She is alert.  Psychiatric:        Mood and Affect: Mood normal.    ED Results / Procedures / Treatments   Labs (all labs ordered are listed, but only abnormal results are  displayed) Labs Reviewed - No data to display  EKG None  Radiology No results found.  Procedures Procedures   Medications Ordered in ED Medications - No data to display  ED Course/ Medical Decision Making/ A&P                           Medical Decision Making  71 year old female presenting with complaint of dental pain.  Reports that she broke her tooth almost a week ago and continues to have pain that is not working with ibuprofen.  She called her dentist who will be able to see her on Tuesday but she cannot take the pain.  Airway was clear, tolerating secretions.  No signs of infection however many caries noted, more in the upper palate.  She will be  discharged with 6 Roxicodone tablets to use until she gets to her dentist.  Proper use was discussed and she is agreeable to the plan.  She will continue to use Tylenol and ibuprofen as well.  SheFinal Clinical Impression(s) / ED Diagnoses Final diagnoses:  Pain, dental    Rx / DC Orders Results and diagnoses were explained to the patient. Return precautions discussed in full. Patient had no additional questions and expressed complete understanding.   This chart was dictated using voice recognition software.  Despite best efforts to proofread,  errors can occur which can change the documentation meaning.    Darliss Ridgel 07/13/21 1942    Gareth Morgan, MD 07/14/21 1018

## 2021-07-13 NOTE — Discharge Instructions (Addendum)
Please follow-up with the dentist.  I have sent 6 oxycodone pills to the pharmacy for you to take for severe pain.  You should also take Tylenol and ibuprofen and rotation with this medication.

## 2021-07-13 NOTE — ED Triage Notes (Signed)
C/o dental pain.  States she broke her R upper tooth 1 week ago.

## 2021-09-27 ENCOUNTER — Ambulatory Visit (INDEPENDENT_AMBULATORY_CARE_PROVIDER_SITE_OTHER): Payer: Medicare HMO | Admitting: Podiatry

## 2021-09-27 DIAGNOSIS — L989 Disorder of the skin and subcutaneous tissue, unspecified: Secondary | ICD-10-CM | POA: Diagnosis not present

## 2021-09-27 NOTE — Progress Notes (Signed)
? ?  Subjective: ?71 y.o. female presenting to the office today for follow-up evaluation of symptomatic calluses to the plantar aspect of the bilateral forefoot.  Patient states that simple debridement does provide months of alleviation of her symptoms.  She is requesting debridement again today. ? ?Past Medical History:  ?Diagnosis Date  ? Allergic rhinitis   ? DVT (deep venous thrombosis) ([facility]) 2008  ? GERD (gastroesophageal reflux disease)   ? Persistent headaches   ? Varicose veins during pregnancy, antepartum   ? ?Past Surgical History:  ?Procedure Laterality Date  ? none    ? ?No Known Allergies ? ?Objective:  ?Physical Exam ?General: Alert and oriented x3 in no acute distress ? ?Dermatology: Hyperkeratotic lesion(s) present on the plantar aspect fifth MTP joint. Pain on palpation with a central nucleated core noted. Skin is warm, dry and supple bilateral lower extremities. Negative for open lesions or macerations. ? ?Vascular: Palpable pedal pulses bilaterally. No edema or erythema noted. Capillary refill within normal limits. ? ?Neurological: Epicritic and protective threshold grossly intact bilaterally.  ? ?Musculoskeletal Exam: Pain on palpation at the keratotic lesion(s) noted. Range of motion within normal limits bilateral.   ?Radiographic exam B/L feet 06/17/2021: Normal osseous mineralization.  Joint spaces mostly preserved.  No acute fracture identified.  No osseous abnormality.  Otherwise normal exam ? ?Assessment: ?1.  Symptomatic preulcerative callus lesions plantar aspect fifth MTP joint bilateral ? ?Plan of Care:  ?1. Patient evaluated.   ?2.  The patient did not get the custom molded orthotics because she was skeptical that they would not help  ?3.  Excisional debridement of the preulcerative callus lesions was performed using a 312 scalpel without incident or bleeding as a courtesy for the patient ?4.  Return to clinic as needed ? ?Edrick Kins, DPM ?[place] ? ?Dr. Edrick Kins, DPM  ?  ?[address]                                    ?[place], [place] [address]                ?Office [phone]  ?Fax [phone] ? ? ? ? ?

## 2021-09-30 ENCOUNTER — Ambulatory Visit: Payer: Medicare HMO | Admitting: Podiatry

## 2021-10-28 DIAGNOSIS — R7309 Other abnormal glucose: Secondary | ICD-10-CM | POA: Diagnosis not present

## 2021-10-28 DIAGNOSIS — Z1211 Encounter for screening for malignant neoplasm of colon: Secondary | ICD-10-CM | POA: Diagnosis not present

## 2021-10-28 DIAGNOSIS — I872 Venous insufficiency (chronic) (peripheral): Secondary | ICD-10-CM | POA: Diagnosis not present

## 2021-10-28 DIAGNOSIS — M5136 Other intervertebral disc degeneration, lumbar region: Secondary | ICD-10-CM | POA: Diagnosis not present

## 2021-10-28 DIAGNOSIS — Z0000 Encounter for general adult medical examination without abnormal findings: Secondary | ICD-10-CM | POA: Diagnosis not present

## 2021-10-28 DIAGNOSIS — E669 Obesity, unspecified: Secondary | ICD-10-CM | POA: Diagnosis not present

## 2021-10-28 DIAGNOSIS — I700 Atherosclerosis of aorta: Secondary | ICD-10-CM | POA: Diagnosis not present

## 2021-10-28 DIAGNOSIS — R946 Abnormal results of thyroid function studies: Secondary | ICD-10-CM | POA: Diagnosis not present

## 2021-10-28 DIAGNOSIS — Z86718 Personal history of other venous thrombosis and embolism: Secondary | ICD-10-CM | POA: Diagnosis not present

## 2021-10-28 DIAGNOSIS — M546 Pain in thoracic spine: Secondary | ICD-10-CM | POA: Diagnosis not present

## 2021-11-08 DIAGNOSIS — I83892 Varicose veins of left lower extremities with other complications: Secondary | ICD-10-CM | POA: Diagnosis not present

## 2021-11-08 DIAGNOSIS — M79662 Pain in left lower leg: Secondary | ICD-10-CM | POA: Diagnosis not present

## 2021-11-08 DIAGNOSIS — I87022 Postthrombotic syndrome with inflammation of left lower extremity: Secondary | ICD-10-CM | POA: Diagnosis not present

## 2021-11-08 DIAGNOSIS — R600 Localized edema: Secondary | ICD-10-CM | POA: Diagnosis not present

## 2021-11-08 DIAGNOSIS — I82532 Chronic embolism and thrombosis of left popliteal vein: Secondary | ICD-10-CM | POA: Diagnosis not present

## 2022-03-03 ENCOUNTER — Ambulatory Visit (INDEPENDENT_AMBULATORY_CARE_PROVIDER_SITE_OTHER): Payer: Medicare HMO | Admitting: Podiatry

## 2022-03-03 DIAGNOSIS — L989 Disorder of the skin and subcutaneous tissue, unspecified: Secondary | ICD-10-CM | POA: Diagnosis not present

## 2022-03-03 NOTE — Progress Notes (Signed)
   Chief Complaint  Patient presents with   Foot Pain    Patient here for left foot pain on the bottom, patient states that she has had pain for 1 month.    Subjective: 71 y.o. female presenting to the office today for follow-up evaluation of symptomatic calluses to the plantar aspect of the bilateral forefoot.  Patient states that simple debridement does provide months of alleviation of her symptoms.  She is requesting debridement again today.  Past Medical History:  Diagnosis Date   Allergic rhinitis    DVT (deep venous thrombosis) ([facility]) 2008   GERD (gastroesophageal reflux disease)    Persistent headaches    Varicose veins during pregnancy, antepartum    Past Surgical History:  Procedure Laterality Date   none     No Known Allergies  Objective:  Physical Exam General: Alert and oriented x3 in no acute distress  Dermatology: Hyperkeratotic lesion(s) present on the plantar aspect fifth MTP joint. Pain on palpation with a central nucleated core noted. Skin is warm, dry and supple bilateral lower extremities. Negative for open lesions or macerations.  Vascular: Palpable pedal pulses bilaterally. No edema or erythema noted. Capillary refill within normal limits.  Neurological: Epicritic and protective threshold grossly intact bilaterally.   Musculoskeletal Exam: Pain on palpation at the keratotic lesion(s) noted. Range of motion within normal limits bilateral.   Radiographic exam B/L feet 06/17/2021: Normal osseous mineralization.  Joint spaces mostly preserved.  No acute fracture identified.  No osseous abnormality.  Otherwise normal exam  Assessment: 1.  Symptomatic preulcerative callus lesions plantar aspect fifth MTP joint bilateral  Plan of Care:  1. Patient evaluated.   2.  The patient did not get the custom molded orthotics because she was skeptical that they would not help  3.  Excisional debridement of the preulcerative callus lesions was performed using a 312 scalpel  without incident or bleeding as a courtesy for the patient 4.  Return to clinic as needed  Edrick Kins, DPM [facility]  Dr. Edrick Kins, DPM    [address], [place] [address]                Office [phone]  Fax [phone]

## 2022-05-05 DIAGNOSIS — I83892 Varicose veins of left lower extremities with other complications: Secondary | ICD-10-CM | POA: Diagnosis not present

## 2022-05-05 DIAGNOSIS — R600 Localized edema: Secondary | ICD-10-CM | POA: Diagnosis not present

## 2022-05-05 DIAGNOSIS — I8312 Varicose veins of left lower extremity with inflammation: Secondary | ICD-10-CM | POA: Diagnosis not present

## 2022-05-14 ENCOUNTER — Ambulatory Visit (INDEPENDENT_AMBULATORY_CARE_PROVIDER_SITE_OTHER): Payer: Medicare HMO | Admitting: Podiatry

## 2022-05-14 DIAGNOSIS — L989 Disorder of the skin and subcutaneous tissue, unspecified: Secondary | ICD-10-CM | POA: Diagnosis not present

## 2022-05-14 NOTE — Progress Notes (Signed)
   Chief Complaint  Patient presents with   Foot Pain    Patient here for left foot pain on the bottom, patient states that she has had pain for 1 month.    Subjective: 71 y.o. female presenting to the office today for follow-up evaluation of symptomatic calluses to the plantar aspect of the bilateral forefoot.  Patient states that simple debridement does provide months of alleviation of her symptoms.  She is requesting debridement again today.  Past Medical History:  Diagnosis Date   Allergic rhinitis    DVT (deep venous thrombosis) ([facility]) 2008   GERD (gastroesophageal reflux disease)    Persistent headaches    Varicose veins during pregnancy, antepartum    Past Surgical History:  Procedure Laterality Date   none     No Known Allergies  Objective:  Physical Exam General: Alert and oriented x3 in no acute distress  Dermatology: Hyperkeratotic lesion(s) present on the plantar aspect fifth MTP joint. Pain on palpation with a central nucleated core noted. Skin is warm, dry and supple bilateral lower extremities. Negative for open lesions or macerations.  Vascular: Palpable pedal pulses bilaterally. No edema or erythema noted. Capillary refill within normal limits.  Neurological: Epicritic and protective threshold grossly intact bilaterally.   Musculoskeletal Exam: Pain on palpation at the keratotic lesion(s) noted. Range of motion within normal limits bilateral.   Radiographic exam B/L feet 06/17/2021: Normal osseous mineralization.  Joint spaces mostly preserved.  No acute fracture identified.  No osseous abnormality.  Otherwise normal exam  Assessment: 1.  Symptomatic calluses plantar aspect of the bilateral feet  Plan of Care:  1. Patient evaluated.   2.  The patient did not get the custom molded orthotics because she was skeptical that they would not help  3.  Excisional debridement of the preulcerative callus lesions was performed using a 312 scalpel without incident or  bleeding as a courtesy for the patient 4.  Return to clinic as needed  Edrick Kins, DPM [facility]  Dr. Edrick Kins, DPM    [address], [place] [address]                Office [phone]  Fax [phone]

## 2022-06-04 DIAGNOSIS — M5136 Other intervertebral disc degeneration, lumbar region: Secondary | ICD-10-CM | POA: Diagnosis not present

## 2022-06-04 DIAGNOSIS — M545 Low back pain, unspecified: Secondary | ICD-10-CM | POA: Diagnosis not present

## 2022-07-07 ENCOUNTER — Ambulatory Visit (INDEPENDENT_AMBULATORY_CARE_PROVIDER_SITE_OTHER): Payer: Medicare HMO | Admitting: Podiatry

## 2022-07-07 ENCOUNTER — Ambulatory Visit (INDEPENDENT_AMBULATORY_CARE_PROVIDER_SITE_OTHER): Payer: Medicare HMO

## 2022-07-07 DIAGNOSIS — M79672 Pain in left foot: Secondary | ICD-10-CM

## 2022-07-07 DIAGNOSIS — S93402A Sprain of unspecified ligament of left ankle, initial encounter: Secondary | ICD-10-CM | POA: Diagnosis not present

## 2022-07-07 MED ORDER — MELOXICAM 15 MG PO TABS: 15.0000 mg | ORAL_TABLET | Freq: Every day | ORAL | 1 refills | Status: DC

## 2022-07-07 NOTE — Progress Notes (Signed)
   Chief Complaint  Patient presents with   Foot Injury    Left foot injury, patient fell on the left foot and leg, patient has been having pain and swelling in the ankle, rate of pain 6 out of 10, X-rays take today,     HPI: 72 y.o. female presenting today for new complaint of pain and tenderness associated to the left foot and ankle.  Patient states that about 1 week ago she fell at her home.  Denies LOC.  She says that since that time she has had persistent pain and tenderness associated to the left foot and ankle.  She presents today for further treatment and evaluation  Past Medical History:  Diagnosis Date   Allergic rhinitis    DVT (deep venous thrombosis) ([facility]) 2008   GERD (gastroesophageal reflux disease)    Persistent headaches    Varicose veins during pregnancy, antepartum     Past Surgical History:  Procedure Laterality Date   none      No Known Allergies   Physical Exam: General: The patient is alert and oriented x3 in no acute distress.  Dermatology: Skin is warm, dry and supple bilateral lower extremities.  No open wounds Vascular: Pulses nonpalpable due to chronic lymphedema.  Chronic lymphedema noted bilateral lower extremities.  There is no erythema or edema  Neurological: Light touch and protective threshold grossly intact  Musculoskeletal Exam: No pedal deformities noted.  There is some tenderness throughout palpation along the lateral aspect of the left ankle and foot.  Radiographic Exam LT foot and ankle 07/07/2022:  Normal osseous mineralization. Joint spaces preserved.  No fractures identified  Assessment: 1.  Left foot and ankle sprain   Plan of Care:  1. Patient evaluated. X-Rays reviewed.  2.  Recommend RICE.  Especially ice in the evenings 3.  Ace wrap dispensed and applied for compression.  Wear daily 4.  Cam boot dispensed.  WBAT x 3 weeks 5.  Prescription for meloxicam 15 mg daily.  Patient currently not on anticoagulant medication  therapy 6.  Return to clinic 4 weeks for follow-up   Edrick Kins, DPM [facility]  Dr. Edrick Kins, DPM    [address], [place] [address]                Office [phone]  Fax [phone]

## 2022-07-09 ENCOUNTER — Ambulatory Visit: Payer: Medicare HMO | Admitting: Podiatry

## 2022-07-16 ENCOUNTER — Other Ambulatory Visit: Payer: Self-pay | Admitting: Podiatry

## 2022-07-16 DIAGNOSIS — M79672 Pain in left foot: Secondary | ICD-10-CM

## 2022-07-16 DIAGNOSIS — S93402A Sprain of unspecified ligament of left ankle, initial encounter: Secondary | ICD-10-CM

## 2022-07-23 DIAGNOSIS — M21962 Unspecified acquired deformity of left lower leg: Secondary | ICD-10-CM | POA: Diagnosis not present

## 2022-07-23 DIAGNOSIS — I739 Peripheral vascular disease, unspecified: Secondary | ICD-10-CM | POA: Diagnosis not present

## 2022-07-23 DIAGNOSIS — M19071 Primary osteoarthritis, right ankle and foot: Secondary | ICD-10-CM | POA: Diagnosis not present

## 2022-07-23 DIAGNOSIS — M21961 Unspecified acquired deformity of right lower leg: Secondary | ICD-10-CM | POA: Diagnosis not present

## 2022-07-23 DIAGNOSIS — B351 Tinea unguium: Secondary | ICD-10-CM | POA: Diagnosis not present

## 2022-07-23 DIAGNOSIS — M19072 Primary osteoarthritis, left ankle and foot: Secondary | ICD-10-CM | POA: Diagnosis not present

## 2022-07-23 DIAGNOSIS — M792 Neuralgia and neuritis, unspecified: Secondary | ICD-10-CM | POA: Diagnosis not present

## 2022-07-23 DIAGNOSIS — L851 Acquired keratosis [keratoderma] palmaris et plantaris: Secondary | ICD-10-CM | POA: Diagnosis not present

## 2022-08-04 ENCOUNTER — Ambulatory Visit: Payer: Medicare HMO | Admitting: Podiatry

## 2022-08-06 ENCOUNTER — Ambulatory Visit: Payer: Medicare HMO | Admitting: Podiatry

## 2022-08-06 DIAGNOSIS — I872 Venous insufficiency (chronic) (peripheral): Secondary | ICD-10-CM | POA: Diagnosis not present

## 2022-09-01 DIAGNOSIS — M19071 Primary osteoarthritis, right ankle and foot: Secondary | ICD-10-CM | POA: Diagnosis not present

## 2022-09-01 DIAGNOSIS — I872 Venous insufficiency (chronic) (peripheral): Secondary | ICD-10-CM | POA: Diagnosis not present

## 2022-09-01 DIAGNOSIS — M19072 Primary osteoarthritis, left ankle and foot: Secondary | ICD-10-CM | POA: Diagnosis not present

## 2022-09-16 DIAGNOSIS — I83893 Varicose veins of bilateral lower extremities with other complications: Secondary | ICD-10-CM | POA: Diagnosis not present

## 2022-09-16 DIAGNOSIS — I83813 Varicose veins of bilateral lower extremities with pain: Secondary | ICD-10-CM | POA: Diagnosis not present

## 2022-10-01 DIAGNOSIS — I872 Venous insufficiency (chronic) (peripheral): Secondary | ICD-10-CM | POA: Diagnosis not present

## 2022-10-06 DIAGNOSIS — I872 Venous insufficiency (chronic) (peripheral): Secondary | ICD-10-CM | POA: Diagnosis not present

## 2022-10-14 DIAGNOSIS — L84 Corns and callosities: Secondary | ICD-10-CM | POA: Diagnosis not present

## 2022-10-14 DIAGNOSIS — D2372 Other benign neoplasm of skin of left lower limb, including hip: Secondary | ICD-10-CM | POA: Diagnosis not present

## 2022-10-14 DIAGNOSIS — L851 Acquired keratosis [keratoderma] palmaris et plantaris: Secondary | ICD-10-CM | POA: Diagnosis not present

## 2022-10-14 DIAGNOSIS — I872 Venous insufficiency (chronic) (peripheral): Secondary | ICD-10-CM | POA: Diagnosis not present

## 2022-10-14 DIAGNOSIS — I70203 Unspecified atherosclerosis of native arteries of extremities, bilateral legs: Secondary | ICD-10-CM | POA: Diagnosis not present

## 2022-11-05 DIAGNOSIS — R7309 Other abnormal glucose: Secondary | ICD-10-CM | POA: Diagnosis not present

## 2022-11-05 DIAGNOSIS — Z79899 Other long term (current) drug therapy: Secondary | ICD-10-CM | POA: Diagnosis not present

## 2022-11-05 DIAGNOSIS — Z13220 Encounter for screening for lipoid disorders: Secondary | ICD-10-CM | POA: Diagnosis not present

## 2022-11-05 DIAGNOSIS — R946 Abnormal results of thyroid function studies: Secondary | ICD-10-CM | POA: Diagnosis not present

## 2022-11-07 DIAGNOSIS — Z79899 Other long term (current) drug therapy: Secondary | ICD-10-CM | POA: Diagnosis not present

## 2022-11-07 DIAGNOSIS — R7309 Other abnormal glucose: Secondary | ICD-10-CM | POA: Diagnosis not present

## 2022-11-07 DIAGNOSIS — R946 Abnormal results of thyroid function studies: Secondary | ICD-10-CM | POA: Diagnosis not present

## 2022-11-07 DIAGNOSIS — Z13220 Encounter for screening for lipoid disorders: Secondary | ICD-10-CM | POA: Diagnosis not present

## 2022-11-10 DIAGNOSIS — Z86718 Personal history of other venous thrombosis and embolism: Secondary | ICD-10-CM | POA: Diagnosis not present

## 2022-11-10 DIAGNOSIS — M546 Pain in thoracic spine: Secondary | ICD-10-CM | POA: Diagnosis not present

## 2022-11-10 DIAGNOSIS — Z0000 Encounter for general adult medical examination without abnormal findings: Secondary | ICD-10-CM | POA: Diagnosis not present

## 2022-11-10 DIAGNOSIS — M5136 Other intervertebral disc degeneration, lumbar region: Secondary | ICD-10-CM | POA: Diagnosis not present

## 2022-12-01 DIAGNOSIS — I872 Venous insufficiency (chronic) (peripheral): Secondary | ICD-10-CM | POA: Diagnosis not present

## 2022-12-29 DIAGNOSIS — I70203 Unspecified atherosclerosis of native arteries of extremities, bilateral legs: Secondary | ICD-10-CM | POA: Diagnosis not present

## 2022-12-29 DIAGNOSIS — D2372 Other benign neoplasm of skin of left lower limb, including hip: Secondary | ICD-10-CM | POA: Diagnosis not present

## 2022-12-29 DIAGNOSIS — L851 Acquired keratosis [keratoderma] palmaris et plantaris: Secondary | ICD-10-CM | POA: Diagnosis not present

## 2022-12-29 DIAGNOSIS — L84 Corns and callosities: Secondary | ICD-10-CM | POA: Diagnosis not present

## 2022-12-29 DIAGNOSIS — I872 Venous insufficiency (chronic) (peripheral): Secondary | ICD-10-CM | POA: Diagnosis not present

## 2022-12-31 DIAGNOSIS — I872 Venous insufficiency (chronic) (peripheral): Secondary | ICD-10-CM | POA: Diagnosis not present

## 2023-01-07 DIAGNOSIS — I872 Venous insufficiency (chronic) (peripheral): Secondary | ICD-10-CM | POA: Diagnosis not present

## 2023-01-07 DIAGNOSIS — L97529 Non-pressure chronic ulcer of other part of left foot with unspecified severity: Secondary | ICD-10-CM | POA: Diagnosis not present

## 2023-02-16 DIAGNOSIS — I872 Venous insufficiency (chronic) (peripheral): Secondary | ICD-10-CM | POA: Diagnosis not present

## 2023-02-20 DIAGNOSIS — I872 Venous insufficiency (chronic) (peripheral): Secondary | ICD-10-CM | POA: Diagnosis not present

## 2023-02-20 DIAGNOSIS — R600 Localized edema: Secondary | ICD-10-CM | POA: Diagnosis not present

## 2023-03-30 DIAGNOSIS — I70203 Unspecified atherosclerosis of native arteries of extremities, bilateral legs: Secondary | ICD-10-CM | POA: Diagnosis not present

## 2023-03-30 DIAGNOSIS — L84 Corns and callosities: Secondary | ICD-10-CM | POA: Diagnosis not present

## 2023-03-30 DIAGNOSIS — D2372 Other benign neoplasm of skin of left lower limb, including hip: Secondary | ICD-10-CM | POA: Diagnosis not present

## 2023-03-30 DIAGNOSIS — L851 Acquired keratosis [keratoderma] palmaris et plantaris: Secondary | ICD-10-CM | POA: Diagnosis not present

## 2023-03-30 DIAGNOSIS — I872 Venous insufficiency (chronic) (peripheral): Secondary | ICD-10-CM | POA: Diagnosis not present

## 2023-05-19 DIAGNOSIS — I872 Venous insufficiency (chronic) (peripheral): Secondary | ICD-10-CM | POA: Diagnosis not present

## 2023-05-19 DIAGNOSIS — I70203 Unspecified atherosclerosis of native arteries of extremities, bilateral legs: Secondary | ICD-10-CM | POA: Diagnosis not present

## 2023-05-19 DIAGNOSIS — L851 Acquired keratosis [keratoderma] palmaris et plantaris: Secondary | ICD-10-CM | POA: Diagnosis not present

## 2023-05-19 DIAGNOSIS — D2372 Other benign neoplasm of skin of left lower limb, including hip: Secondary | ICD-10-CM | POA: Diagnosis not present

## 2023-05-19 DIAGNOSIS — L84 Corns and callosities: Secondary | ICD-10-CM | POA: Diagnosis not present

## 2023-07-20 DIAGNOSIS — R7309 Other abnormal glucose: Secondary | ICD-10-CM | POA: Diagnosis not present

## 2023-07-20 DIAGNOSIS — R059 Cough, unspecified: Secondary | ICD-10-CM | POA: Diagnosis not present

## 2023-07-20 DIAGNOSIS — Z86718 Personal history of other venous thrombosis and embolism: Secondary | ICD-10-CM | POA: Diagnosis not present

## 2023-07-20 DIAGNOSIS — R946 Abnormal results of thyroid function studies: Secondary | ICD-10-CM | POA: Diagnosis not present

## 2023-07-20 DIAGNOSIS — Z79899 Other long term (current) drug therapy: Secondary | ICD-10-CM | POA: Diagnosis not present

## 2023-08-07 DIAGNOSIS — Z1231 Encounter for screening mammogram for malignant neoplasm of breast: Secondary | ICD-10-CM | POA: Diagnosis not present

## 2023-08-18 ENCOUNTER — Ambulatory Visit: Admitting: Podiatry

## 2023-08-19 ENCOUNTER — Ambulatory Visit (INDEPENDENT_AMBULATORY_CARE_PROVIDER_SITE_OTHER): Admitting: Podiatry

## 2023-08-19 ENCOUNTER — Encounter: Payer: Self-pay | Admitting: Podiatry

## 2023-08-19 DIAGNOSIS — D492 Neoplasm of unspecified behavior of bone, soft tissue, and skin: Secondary | ICD-10-CM | POA: Diagnosis not present

## 2023-08-19 DIAGNOSIS — D2372 Other benign neoplasm of skin of left lower limb, including hip: Secondary | ICD-10-CM

## 2023-08-20 NOTE — Progress Notes (Signed)
   Chief Complaint  Patient presents with   Callouses    bilateral feet pain possible due to callus build up patient states not taking any medications at this time only tylenol.    Subjective: 73 y.o. female presenting to the office today for complaint of generalized foot pain when walking as well as calluses that build up around the bilateral feet.  No history of injury.  Chronic.   Past Medical History:  Diagnosis Date   Allergic rhinitis    DVT (deep venous thrombosis) ([facility]) 2008   GERD (gastroesophageal reflux disease)    Persistent headaches    Varicose veins during pregnancy, antepartum     Past Surgical History:  Procedure Laterality Date   none      No Known Allergies   Objective:  Physical Exam General: Alert and oriented x3 in no acute distress  Dermatology: Hyperkeratotic lesion(s) present on the plantar aspect bilateral feet. Pain on palpation with a central nucleated core noted. Skin is warm, dry and supple bilateral lower extremities. Negative for open lesions or macerations.  Vascular: Palpable pedal pulses bilaterally. No edema or erythema noted. Capillary refill within normal limits.  Neurological: Grossly intact via light touch  Musculoskeletal Exam: Pain on palpation at the keratotic lesion(s) noted. Range of motion within normal limits bilateral. Muscle strength 5/5 in all groups bilateral.  Assessment: 1.  Symptomatic benign skin lesion 2.  Generalized foot pain bilateral  Plan of Care:  -Patient evaluated -Excisional debridement of keratoic lesion(s) using a chisel blade was performed without incident.  - Stressed the importance of wearing good supportive shoes even inside of the home.  Patient walks throughout her house barefoot on hardwood floors the majority of the day.  Advised against this.  Explained that this is the reason that her calluses are likely developing and becoming symptomatic and she is experiencing generalized foot pain -Return  to clinic as needed   Felecia Shelling, DPM [facility]  Dr. Felecia Shelling, DPM    [address] [place], [place] [address]                Office [phone]  Fax [phone]

## 2023-09-09 ENCOUNTER — Encounter (HOSPITAL_COMMUNITY): Payer: Self-pay

## 2023-09-09 ENCOUNTER — Other Ambulatory Visit: Payer: Self-pay

## 2023-09-09 ENCOUNTER — Emergency Department (HOSPITAL_COMMUNITY)

## 2023-09-09 ENCOUNTER — Emergency Department (HOSPITAL_COMMUNITY)
Admission: EM | Admit: 2023-09-09 | Discharge: 2023-09-09 | Disposition: A | Discharge status: Home / Self Care | Attending: Emergency Medicine | Admitting: Emergency Medicine

## 2023-09-09 DIAGNOSIS — R0602 Shortness of breath: Secondary | ICD-10-CM

## 2023-09-09 DIAGNOSIS — M7989 Other specified soft tissue disorders: Secondary | ICD-10-CM | POA: Diagnosis not present

## 2023-09-09 DIAGNOSIS — I82401 Acute embolism and thrombosis of unspecified deep veins of right lower extremity: Secondary | ICD-10-CM | POA: Insufficient documentation

## 2023-09-09 DIAGNOSIS — Z7901 Long term (current) use of anticoagulants: Secondary | ICD-10-CM | POA: Diagnosis not present

## 2023-09-09 DIAGNOSIS — I2510 Atherosclerotic heart disease of native coronary artery without angina pectoris: Secondary | ICD-10-CM | POA: Diagnosis not present

## 2023-09-09 DIAGNOSIS — I82411 Acute embolism and thrombosis of right femoral vein: Secondary | ICD-10-CM | POA: Diagnosis not present

## 2023-09-09 DIAGNOSIS — I2699 Other pulmonary embolism without acute cor pulmonale: Secondary | ICD-10-CM | POA: Diagnosis not present

## 2023-09-09 DIAGNOSIS — I82431 Acute embolism and thrombosis of right popliteal vein: Secondary | ICD-10-CM | POA: Diagnosis not present

## 2023-09-09 DIAGNOSIS — R918 Other nonspecific abnormal finding of lung field: Secondary | ICD-10-CM | POA: Diagnosis not present

## 2023-09-09 DIAGNOSIS — I872 Venous insufficiency (chronic) (peripheral): Secondary | ICD-10-CM | POA: Diagnosis not present

## 2023-09-09 LAB — TROPONIN I (HIGH SENSITIVITY)
Troponin I (High Sensitivity): 6 ng/L (ref <18)
Troponin I (High Sensitivity): 7 ng/L (ref <18)

## 2023-09-09 LAB — CBC WITH DIFFERENTIAL/PLATELET
Abs Immature Granulocytes: 0.02 10*3/uL (ref 0.00–0.07)
Basophils Absolute: 0 10*3/uL (ref 0.0–0.1)
Basophils Relative: 0 %
Eosinophils Absolute: 0.2 10*3/uL (ref 0.0–0.5)
Eosinophils Relative: 3 %
HCT: 42.3 % (ref 36.0–46.0)
Hemoglobin: 13.6 g/dL (ref 12.0–15.0)
Immature Granulocytes: 0 %
Lymphocytes Relative: 50 %
Lymphs Abs: 3.1 10*3/uL (ref 0.7–4.0)
MCH: 29.8 pg (ref 26.0–34.0)
MCHC: 32.2 g/dL (ref 30.0–36.0)
MCV: 92.6 fL (ref 80.0–100.0)
Monocytes Absolute: 0.6 10*3/uL (ref 0.1–1.0)
Monocytes Relative: 9 %
Neutro Abs: 2.4 10*3/uL (ref 1.7–7.7)
Neutrophils Relative %: 38 %
Platelets: 187 10*3/uL (ref 150–400)
RBC: 4.57 MIL/uL (ref 3.87–5.11)
RDW: 13.2 % (ref 11.5–15.5)
WBC: 6.3 10*3/uL (ref 4.0–10.5)
nRBC: 0 % (ref 0.0–0.2)

## 2023-09-09 LAB — COMPREHENSIVE METABOLIC PANEL WITH GFR
ALT: 19 U/L (ref 0–44)
AST: 27 U/L (ref 15–41)
Albumin: 3.6 g/dL (ref 3.5–5.0)
Alkaline Phosphatase: 57 U/L (ref 38–126)
Anion gap: 8 (ref 5–15)
BUN: 16 mg/dL (ref 8–23)
CO2: 23 mmol/L (ref 22–32)
Calcium: 9.3 mg/dL (ref 8.9–10.3)
Chloride: 107 mmol/L (ref 98–111)
Creatinine, Ser: 0.8 mg/dL (ref 0.44–1.00)
GFR, Estimated: >60 mL/min (ref >60)
Glucose, Bld: 85 mg/dL (ref 70–99)
Potassium: 4.1 mmol/L (ref 3.5–5.1)
Sodium: 138 mmol/L (ref 135–145)
Total Bilirubin: 0.7 mg/dL (ref 0.0–1.2)
Total Protein: 7.8 g/dL (ref 6.5–8.1)

## 2023-09-09 LAB — PROTIME-INR
INR: 1 (ref 0.8–1.2)
Prothrombin Time: 13.4 s (ref 11.4–15.2)

## 2023-09-09 LAB — BRAIN NATRIURETIC PEPTIDE: B Natriuretic Peptide: 210.3 pg/mL — ABNORMAL HIGH (ref 0.0–100.0)

## 2023-09-09 MED ORDER — IOHEXOL 350 MG/ML SOLN
75.0000 mL | Freq: Once | INTRAVENOUS | Status: AC | PRN
  Administered 2023-09-09: 75 mL via INTRAVENOUS

## 2023-09-09 MED ORDER — APIXABAN (ELIQUIS) VTE STARTER PACK (10MG AND 5MG): ORAL_TABLET | ORAL | 0 refills | Status: DC

## 2023-09-09 NOTE — Discharge Instructions (Addendum)
 You were seen in the emergency department for new diagnosis of blood clot in your right leg.  You were also having shortness of breath so given evaluation for pulmonary embolism.  Your CAT scan showed no evidence of pulmonary embolism.  You did have a pulmonary nodule that was unchanged from priors and some subtle findings that may represent some chronic interstitial lung disease.  These will need to be followed up with your primary care doctor.  You are starting you on a blood thinner for the blood clot in your leg.

## 2023-09-09 NOTE — ED Provider Notes (Signed)
 [place] EMERGENCY DEPARTMENT AT [facility] Provider Note   CSN: [id number] Arrival date & time: 09/09/23  1239     History {Add pertinent medical, surgical, social history, OB history to HPI:1} Chief Complaint  Patient presents with   Shortness of Breath    Pamela Flores is a 73 y.o. female.  She saw her vascular surgeon today and they did an ultrasound and told her she had a DVT in her right femoral vein.  Sent her here for CAT scan.  She has been feeling short of breath for a few weeks.  Associated with a pinching pain in her left chest.  She has a remote history of DVT 20 years ago and is not currently on anticoagulation.  She saw the vascular surgeon for follow-up for some vein surgery that she had in the past.  Non-smoker no history of cancer.  No significant bleeding recently  The history is provided by the patient.  Shortness of Breath Severity:  Moderate Onset quality:  Gradual Duration:  3 weeks Timing:  Constant Progression:  Unchanged Chronicity:  New Relieved by:  Nothing Worsened by:  Activity Ineffective treatments:  Rest Associated symptoms: chest pain   Associated symptoms: no abdominal pain, no cough, no diaphoresis, no fever, no hemoptysis, no sputum production and no vomiting   Risk factors: hx of PE/DVT   Risk factors: no recent surgery and no tobacco use        Home Medications Prior to Admission medications   Medication Sig Start Date End Date Taking? Authorizing Provider  albuterol (VENTOLIN HFA) 108 (90 Base) MCG/ACT inhaler 2 puffs 4x per day as needed. as needed Patient not taking: Reported on 08/19/2023 06/06/10   [provider]  apixaban  (ELIQUIS ) 5 MG TABS tablet See admin instructions. Patient not taking: Reported on 08/19/2023    [provider]  Carboxymethylcellulose Sodium 1 % GEL 1 drop to each eye, okay for generic/otc Patient not taking: Reported on 08/19/2023 08/05/18   [provider]   Cholecalciferol 25 MCG (1000 UT) capsule Take by mouth. Patient not taking: Reported on 08/19/2023 08/24/07   [provider]  cyclobenzaprine (FLEXERIL) 5 MG tablet 1 tablet Patient not taking: Reported on 08/19/2023 03/28/20   [provider]  FERROUS SULFATE PO Take 1 tablet by mouth daily. Patient not taking: Reported on 08/19/2023 08/24/07   [provider]  fluticasone Odis Bennetts) 50 MCG/ACT nasal spray 2 sprays in each nostril Patient not taking: Reported on 08/19/2023 08/05/18   [provider]  meloxicam  (MOBIC ) 15 MG tablet Take 1 tablet (15 mg total) by mouth daily. Patient not taking: Reported on 08/19/2023 07/07/22   Dot Gazella, DPM  omeprazole (PRILOSEC) 20 MG capsule 1 cap by mouth daily before eating dinner Patient not taking: Reported on 08/19/2023 08/24/07   [provider]  oxyCODONE  (ROXICODONE ) 5 MG immediate release tablet Take 1 tablet (5 mg total) by mouth every 6 (six) hours as needed for severe pain. Patient not taking: Reported on 08/19/2023 07/13/21   Redwine, Madison A, PA-C  polyethylene glycol-electrolytes (NULYTELY/GOLYTELY) 420 g solution See admin instructions. Patient not taking: Reported on 08/19/2023 03/19/18   [provider]  promethazine-codeine (PHENERGAN WITH CODEINE) 6.25-10 MG/5ML syrup 5-10 ml by mouth 4 times a day as needed for cough Patient not taking: Reported on 08/19/2023 06/06/10   [provider]  trimethoprim-polymyxin b (POLYTRIM) ophthalmic solution 1 drop into affected eye Patient not taking: Reported on 08/19/2023  [provider]      Allergies    Patient has no known allergies.    Review of Systems   Review of Systems  Constitutional:  Negative for diaphoresis and fever.  Respiratory:  Positive for shortness of breath. Negative for cough, hemoptysis and sputum production.   Cardiovascular:  Positive for chest pain.  Gastrointestinal:  Negative for abdominal pain and vomiting.     Physical Exam Updated Vital Signs BP (!) 175/103 (BP Location: Left Arm)   Pulse 65   Temp 97.8 F (36.6 C) (Oral)   Resp 16   Ht 5\' 8"  (1.727 m)   Wt 108.9 kg   SpO2 98%   BMI 36.49 kg/m  Physical Exam Vitals and nursing note reviewed.  Constitutional:      General: She is not in acute distress.    Appearance: Normal appearance. She is well-developed.  HENT:     Head: Normocephalic and atraumatic.  Eyes:     Conjunctiva/sclera: Conjunctivae normal.  Cardiovascular:     Rate and Rhythm: Normal rate and regular rhythm.     Heart sounds: No murmur heard. Pulmonary:     Effort: Pulmonary effort is normal. No respiratory distress.     Breath sounds: Normal breath sounds. No stridor. No wheezing.  Abdominal:     Palpations: Abdomen is soft.     Tenderness: There is no abdominal tenderness. There is no guarding or rebound.  Musculoskeletal:        General: No tenderness or deformity. Normal range of motion.     Cervical back: Neck supple.     Right lower leg: No tenderness. Edema present.     Left lower leg: No tenderness. Edema present.  Skin:    General: Skin is warm and dry.  Neurological:     General: No focal deficit present.     Mental Status: She is alert.     GCS: GCS eye subscore is 4. GCS verbal subscore is 5. GCS motor subscore is 6.     ED Results / Procedures / Treatments   Labs (all labs ordered are listed, but only abnormal results are displayed) Labs Reviewed  CBC WITH DIFFERENTIAL/PLATELET  COMPREHENSIVE METABOLIC PANEL WITH GFR  BRAIN NATRIURETIC PEPTIDE  PROTIME-INR  TROPONIN I (HIGH SENSITIVITY)    EKG EKG Interpretation Date/Time:  Wednesday September 09 2023 13:21:52 EDT Ventricular Rate:  57 PR Interval:  149 QRS Duration:  103 QT Interval:  419 QTC Calculation: 408 R Axis:   35  Text Interpretation: Sinus rhythm Borderline T abnormalities, diffuse leads new t wave abdnormality since prior 10/09 Confirmed by Racheal Buddle [phone])  on 09/09/2023 4:12:27 PM  Radiology No results found.  Procedures Procedures  {Document cardiac monitor, telemetry assessment procedure when appropriate:1}  Medications Ordered in ED Medications - No data to display  ED Course/ Medical Decision Making/ A&P   {   Click here for ABCD2, HEART and other calculatorsREFRESH Note before signing :1}                              Medical Decision Making Amount and/or Complexity of Data Reviewed Labs: ordered. Radiology: ordered.   This patient complains of ***; this involves an extensive number of treatment Options and is a complaint that carries with it a high risk of complications and morbidity. The differential includes ***  I ordered, reviewed and interpreted labs, which included *** I ordered medication *** and  reviewed PMP when indicated. I ordered imaging studies which included *** and I independently    visualized and interpreted imaging which showed *** Additional history obtained from *** Previous records obtained and reviewed *** I consulted *** and discussed lab and imaging findings and discussed disposition.  Cardiac monitoring reviewed, *** Social determinants considered, *** Critical Interventions: ***  After the interventions stated above, I reevaluated the patient and found *** Admission and further testing considered, ***   {Document critical care time when appropriate:1} {Document review of labs and clinical decision tools ie heart score, Chads2Vasc2 etc:1}  {Document your independent review of radiology images, and any outside records:1} {Document your discussion with family members, caretakers, and with consultants:1} {Document social determinants of health affecting pt's care:1} {Document your decision making why or why not admission, treatments were needed:1} Final Clinical Impression(s) / ED Diagnoses Final diagnoses:  None    Rx / DC Orders ED Discharge Orders     None

## 2023-09-09 NOTE — ED Triage Notes (Signed)
 Exertional dyspnea for a month, diagnosed with right DVT today at pulmonologist office. No blood thinners. Pulmonologist wants pt checked for PE due to sob with exertion.

## 2023-09-14 DIAGNOSIS — I82401 Acute embolism and thrombosis of unspecified deep veins of right lower extremity: Secondary | ICD-10-CM | POA: Diagnosis not present

## 2023-10-07 ENCOUNTER — Ambulatory Visit: Admitting: Podiatry

## 2023-10-20 ENCOUNTER — Ambulatory Visit (INDEPENDENT_AMBULATORY_CARE_PROVIDER_SITE_OTHER): Admitting: Podiatry

## 2023-10-20 ENCOUNTER — Encounter: Payer: Self-pay | Admitting: Podiatry

## 2023-10-20 VITALS — Ht 68.0 in | Wt 240.0 lb

## 2023-10-20 DIAGNOSIS — D2372 Other benign neoplasm of skin of left lower limb, including hip: Secondary | ICD-10-CM | POA: Diagnosis not present

## 2023-10-20 NOTE — Progress Notes (Signed)
   Chief Complaint  Patient presents with   Foot Pain    Pt is here due to bilateral foot pain states this issue is ongoing pain comes and goes pain level at a 9 out 10.    Subjective: 73 y.o. female presenting to the office today for complaint of generalized foot pain when walking as well as calluses that build up around the bilateral feet.  No history of injury.  Chronic.   Past Medical History:  Diagnosis Date   Allergic rhinitis    DVT (deep venous thrombosis) ([facility]) 2008   GERD (gastroesophageal reflux disease)    Persistent headaches    Varicose veins during pregnancy, antepartum     Past Surgical History:  Procedure Laterality Date   none      No Known Allergies   Objective:  Physical Exam General: Alert and oriented x3 in no acute distress  Dermatology: Hyperkeratotic lesion(s) present on the plantar aspect bilateral feet. Pain on palpation with a central nucleated core noted. Skin is warm, dry and supple bilateral lower extremities. Negative for open lesions or macerations.  Vascular: Palpable pedal pulses bilaterally. No edema or erythema noted. Capillary refill within normal limits.  Neurological: Grossly intact via light touch  Musculoskeletal Exam: Pain on palpation at the keratotic lesion(s) noted. Range of motion within normal limits bilateral. Muscle strength 5/5 in all groups bilateral.  Assessment: 1.  Eccrine poroma bilateral 2.  Generalized foot pain bilateral  Plan of Care:  -Patient evaluated -Excisional debridement of keratoic lesion(s) using a chisel blade was performed without incident.  - Salicylic acid and a Band-Aid applied -Recommend OTC salicylic acid daily PRN -Return to clinic 6 weeks   Dot Gazella, DPM [facility]  Dr. Dot Gazella, DPM    [address] [place], [place] [address]                Office [phone]  Fax [phone]

## 2023-11-02 ENCOUNTER — Inpatient Hospital Stay

## 2023-11-02 ENCOUNTER — Encounter: Payer: Self-pay | Admitting: Hematology and Oncology

## 2023-11-02 ENCOUNTER — Telehealth: Payer: Self-pay

## 2023-11-02 ENCOUNTER — Inpatient Hospital Stay: Attending: Hematology and Oncology | Admitting: Hematology and Oncology

## 2023-11-02 VITALS — BP 138/91 | HR 64 | Temp 98.3°F | Resp 18 | Ht 68.0 in | Wt 245.4 lb

## 2023-11-02 DIAGNOSIS — I82411 Acute embolism and thrombosis of right femoral vein: Secondary | ICD-10-CM

## 2023-11-02 DIAGNOSIS — Z86718 Personal history of other venous thrombosis and embolism: Secondary | ICD-10-CM | POA: Insufficient documentation

## 2023-11-02 DIAGNOSIS — I83893 Varicose veins of bilateral lower extremities with other complications: Secondary | ICD-10-CM

## 2023-11-02 DIAGNOSIS — Z7901 Long term (current) use of anticoagulants: Secondary | ICD-10-CM | POA: Insufficient documentation

## 2023-11-02 DIAGNOSIS — I82401 Acute embolism and thrombosis of unspecified deep veins of right lower extremity: Secondary | ICD-10-CM

## 2023-11-02 NOTE — Telephone Encounter (Signed)
-----   Message from Pamela Flores sent at 11/02/2023 11:05 AM EDT ----- Can you request a copy of vascular US  report from her vascular Dr. Jamse Mcgee office? She was diagnosed with DVT in April 2025

## 2023-11-02 NOTE — Progress Notes (Signed)
 [facility] CONSULT NOTE  Patient Care Team: Pete Brand, DO as PCP - General (Family Medicine)   ASSESSMENT & PLAN:  DVT (deep venous thrombosis) (HCC) She was diagnosed with acute right lower extremity DVT in April 2025 I do not have copy of the report of the ultrasound performed by her vascular physician, we will obtain a copy for reference Overall, I felt that it could be provoked due to poor mobility with her job, poor oral fluid intake and deranged vasculature from prior vein surgeries contributing to the diagnosis I recommend minimum 6 months of anticoagulation therapy until end of October; I will see her at that point in time and we can lower her dose to 2.5 mg twice daily for secondary prevention lifelong due to high risk of recurrent DVT in the future The patient is educated to watch out for signs and symptoms of bleeding I recommend the patient to pursue colonoscopy screening to rule out malignancy as a potential cause of provoked DVT  I recommend the patient to use elastic compression stockings at 20-30 mmHg to reduce risks of chronic thrombophlebitis.  Since the duration of treatment will be lifelong, there is no benefit of screening for thrombophilic disorder  Varicose veins of bilateral lower extremities with other complications She has chronic bilateral lower extremity edema and has chronic venous stasis changes from varicose veins She follows with a vascular physician at [facility] I believe due to this chronic changes and poor vascular drainage, she would always be at high risk of recurrent DVT and she will be a candidate for lifelong treatment because of this  Orders Placed This Encounter  Procedures   CBC with Differential (Cancer Center Only)    Standing Status:   Future    Expiration Date:   11/01/2024   Basic Metabolic Panel - Cancer Center Only    Standing Status:   Future    Expiration Date:   11/01/2024   D-dimer, quantitative    Standing Status:    Future    Expiration Date:   11/01/2024     All questions were answered. The patient knows to call the clinic with any problems, questions or concerns. The total time spent in the appointment was 60 minutes encounter with patients including review of chart and various tests results, discussions about plan of care and coordination of care plan  Pamela Jacobs, MD 6/16/202511:03 AM  CHIEF COMPLAINTS/PURPOSE OF CONSULTATION:  Recurrent DVT  HISTORY OF PRESENTING ILLNESS:  Pamela Flores 73 y.o. female is here because of recent findings of recurrent DVT The patient has remote history of left lower extremity DVT Around Christmas of 2008, the patient fell at home and was not able to move overnight She subsequently was able to get up and went to the emergency department for evaluation and at that time, she had left lower extremity pain but was not evaluated until January 2029 According to venous Doppler ultrasound dated June 18, 2007, acute thrombus was seen in the left lower extremity common femoral vein to popliteal vein The patient was anticoagulated with warfarin On February 26, 2008, she had repeat Doppler ultrasound and was found to have nonocclusive old thrombus of the left popliteal region Her anticoagulation was subsequently discontinued  The patient had venous surgery a year ago for varicose vein She was seen by her vascular surgeon in April 2025 and was found to have acute thrombus in the right lower extremity The actual venous Doppler ultrasound report was not available but she  was sent to the emergency department on the same day on September 09, 2023 for CT imaging which showed no evidence of PE.  This time, she developed acute DVT on the right femoral vein. According to the patient, she could not recall any trauma leading to the diagnosis She works part-time in her job requires her to be sitting or standing and she has a somewhat sedentary lifestyle.  She drinks approximately 48 ounces of  liquid. There were no preceding surgery, long distance travel, hormone use, smoking or trauma to the right lower extremity She is up-to-date with all screening except she has not have colonoscopy done ever.  She has never been diagnosed with cancer When she was found to have acute DVT, she was placed on Eliquis .  She denies bleeding complications She had prior surgeries before and never had perioperative thromboembolic events. The patient had never been exposed to birth control pills or hormone replacement therapy  The patient had never been pregnant before   MEDICAL HISTORY:  Past Medical History:  Diagnosis Date   Allergic rhinitis    DVT (deep venous thrombosis) (HCC) 2008   GERD (gastroesophageal reflux disease)    Persistent headaches    Varicose veins during pregnancy, antepartum     SURGICAL HISTORY: Past Surgical History:  Procedure Laterality Date   none     VARICOSE VEIN SURGERY Bilateral     SOCIAL HISTORY: Social History   Socioeconomic History   Marital status: Married    Spouse name: Not on file   Number of children: Not on file   Years of education: Not on file   Highest education level: Not on file  Occupational History   Not on file  Tobacco Use   Smoking status: Never   Smokeless tobacco: Never  Substance and Sexual Activity   Alcohol use: No   Drug use: No   Sexual activity: Never  Other Topics Concern   Not on file  Social History Narrative   Not on file   Social Drivers of Health   Financial Resource Strain: Not on file  Food Insecurity: No Food Insecurity (11/02/2023)   Hunger Vital Sign    Worried About Running Out of Food in the Last Year: Never true    Ran Out of Food in the Last Year: Never true  Transportation Needs: No Transportation Needs (11/02/2023)   PRAPARE - Administrator, Civil Service (Medical): No    Lack of Transportation (Non-Medical): No  Physical Activity: Not on file  Stress: Not on file  Social  Connections: Not on file  Intimate Partner Violence: Not At Risk (11/02/2023)   Humiliation, Afraid, Rape, and Kick questionnaire    Fear of Current or Ex-Partner: No    Emotionally Abused: No    Physically Abused: No    Sexually Abused: No    FAMILY HISTORY: History reviewed. No pertinent family history.  ALLERGIES:  has no known allergies.  MEDICATIONS:  Current Outpatient Medications  Medication Sig Dispense Refill   Ascorbic Acid (VITAMIN C) 500 MG CAPS Take 500 mg by mouth daily.     cholecalciferol (VITAMIN D3) 25 MCG (1000 UNIT) tablet Take 1,000 Units by mouth daily.     ELIQUIS  5 MG TABS tablet Take 5 mg by mouth 2 (two) times daily.     No current facility-administered medications for this visit.    REVIEW OF SYSTEMS:   Constitutional: Denies fevers, chills or abnormal night sweats Eyes: Denies blurriness of vision, double  vision or watery eyes Ears, nose, mouth, throat, and face: Denies mucositis or sore throat Respiratory: Denies cough, dyspnea or wheezes Cardiovascular: Denies palpitation, chest discomfort or lower extremity swelling Gastrointestinal:  Denies nausea, heartburn or change in bowel habits Skin: Denies abnormal skin rashes Lymphatics: Denies new lymphadenopathy or easy bruising Neurological:Denies numbness, tingling or new weaknesses Behavioral/Psych: Mood is stable, no new changes  All other systems were reviewed with the patient and are negative.  PHYSICAL EXAMINATION: ECOG PERFORMANCE STATUS: 1 - Symptomatic but completely ambulatory  Vitals:   11/02/23 0937  BP: (!) 138/91  Pulse: 64  Resp: 18  Temp: 98.3 F (36.8 C)  SpO2: 97%   Filed Weights   11/02/23 0937  Weight: 245 lb 6.4 oz (111.3 kg)    GENERAL:alert, no distress and comfortable SKIN: skin color, texture, turgor are normal, no rashes or significant lesions EYES: normal, conjunctiva are pink and non-injected, sclera clear OROPHARYNX:no exudate, no erythema and lips, buccal  mucosa, and tongue normal  NECK: supple, thyroid  normal size, non-tender, without nodularity LYMPH:  no palpable lymphadenopathy in the cervical, axillary or inguinal LUNGS: clear to auscultation and percussion with normal breathing effort HEART: regular rate & rhythm and no murmurs with moderate bilateral lower extremity edema with significant chronic venous stasis changes ABDOMEN:abdomen soft, non-tender and normal bowel sounds Musculoskeletal:no cyanosis of digits and no clubbing  PSYCH: alert & oriented x 3 with fluent speech NEURO: no focal motor/sensory deficits  LABORATORY DATA:  I have reviewed the data as listed    Latest Ref Rng & Units 09/09/2023    1:10 PM 09/22/2016    1:59 PM 07/06/2009    4:13 AM  CBC  WBC 4.0 - 10.5 K/uL 6.3  6.4    Hemoglobin 12.0 - 15.0 g/dL 22.0  25.4  27.0   Hematocrit 36.0 - 46.0 % 42.3  42.0  43.0   Platelets 150 - 400 K/uL 187  217

## 2023-11-02 NOTE — Assessment & Plan Note (Signed)
 She has chronic bilateral lower extremity edema and has chronic venous stasis changes from varicose veins She follows with a vascular physician at [facility] I believe due to this chronic changes and poor vascular drainage, she would always be at high risk of recurrent DVT and she will be a candidate for lifelong treatment because of this

## 2023-11-02 NOTE — Telephone Encounter (Signed)
 Called and requested US  report. Faxed a request to [phone] as instructed. Received a fax confirmation.

## 2023-11-02 NOTE — Assessment & Plan Note (Addendum)
 She was diagnosed with acute right lower extremity DVT in April 2025 I do not have copy of the report of the ultrasound performed by her vascular physician, we will obtain a copy for reference Overall, I felt that it could be provoked due to poor mobility with her job, poor oral fluid intake and deranged vasculature from prior vein surgeries contributing to the diagnosis I recommend minimum 6 months of anticoagulation therapy until end of October; I will see her at that point in time and we can lower her dose to 2.5 mg twice daily for secondary prevention lifelong due to high risk of recurrent DVT in the future The patient is educated to watch out for signs and symptoms of bleeding I recommend the patient to pursue colonoscopy screening to rule out malignancy as a potential cause of provoked DVT  I recommend the patient to use elastic compression stockings at 20-30 mmHg to reduce risks of chronic thrombophlebitis.  Since the duration of treatment will be lifelong, there is no benefit of screening for thrombophilic disorder

## 2023-11-09 DIAGNOSIS — R946 Abnormal results of thyroid function studies: Secondary | ICD-10-CM | POA: Diagnosis not present

## 2023-11-09 DIAGNOSIS — Z86718 Personal history of other venous thrombosis and embolism: Secondary | ICD-10-CM | POA: Diagnosis not present

## 2023-11-09 DIAGNOSIS — Z79899 Other long term (current) drug therapy: Secondary | ICD-10-CM | POA: Diagnosis not present

## 2023-11-09 DIAGNOSIS — R7309 Other abnormal glucose: Secondary | ICD-10-CM | POA: Diagnosis not present

## 2023-11-09 DIAGNOSIS — Z13220 Encounter for screening for lipoid disorders: Secondary | ICD-10-CM | POA: Diagnosis not present

## 2023-11-10 DIAGNOSIS — Z13220 Encounter for screening for lipoid disorders: Secondary | ICD-10-CM | POA: Diagnosis not present

## 2023-11-10 DIAGNOSIS — Z79899 Other long term (current) drug therapy: Secondary | ICD-10-CM | POA: Diagnosis not present

## 2023-11-10 DIAGNOSIS — R946 Abnormal results of thyroid function studies: Secondary | ICD-10-CM | POA: Diagnosis not present

## 2023-11-10 DIAGNOSIS — Z86718 Personal history of other venous thrombosis and embolism: Secondary | ICD-10-CM | POA: Diagnosis not present

## 2023-11-10 DIAGNOSIS — R7309 Other abnormal glucose: Secondary | ICD-10-CM | POA: Diagnosis not present

## 2023-11-11 ENCOUNTER — Ambulatory Visit: Admitting: Neurology

## 2023-11-16 DIAGNOSIS — I872 Venous insufficiency (chronic) (peripheral): Secondary | ICD-10-CM | POA: Diagnosis not present

## 2023-11-16 DIAGNOSIS — Z79899 Other long term (current) drug therapy: Secondary | ICD-10-CM | POA: Diagnosis not present

## 2023-11-16 DIAGNOSIS — R7309 Other abnormal glucose: Secondary | ICD-10-CM | POA: Diagnosis not present

## 2023-11-16 DIAGNOSIS — Z86718 Personal history of other venous thrombosis and embolism: Secondary | ICD-10-CM | POA: Diagnosis not present

## 2023-11-16 DIAGNOSIS — Z7901 Long term (current) use of anticoagulants: Secondary | ICD-10-CM | POA: Diagnosis not present

## 2023-11-16 DIAGNOSIS — M5134 Other intervertebral disc degeneration, thoracic region: Secondary | ICD-10-CM | POA: Diagnosis not present

## 2023-11-16 DIAGNOSIS — E669 Obesity, unspecified: Secondary | ICD-10-CM | POA: Diagnosis not present

## 2023-11-16 DIAGNOSIS — Z0000 Encounter for general adult medical examination without abnormal findings: Secondary | ICD-10-CM | POA: Diagnosis not present

## 2023-11-16 DIAGNOSIS — I700 Atherosclerosis of aorta: Secondary | ICD-10-CM | POA: Diagnosis not present

## 2023-11-24 ENCOUNTER — Telehealth: Payer: Self-pay | Admitting: Podiatry

## 2023-11-24 NOTE — Telephone Encounter (Signed)
 LVM 2x to reschedule appt. since we were unable to contact patient, appt was moved to dr egerton's schedule to make sure she still has an appt scheduled.

## 2023-11-30 ENCOUNTER — Encounter: Payer: Self-pay | Admitting: Podiatrist

## 2023-11-30 ENCOUNTER — Ambulatory Visit: Admitting: Podiatry

## 2023-11-30 ENCOUNTER — Ambulatory Visit (INDEPENDENT_AMBULATORY_CARE_PROVIDER_SITE_OTHER): Admitting: Podiatrist

## 2023-11-30 VITALS — Ht 68.0 in | Wt 245.5 lb

## 2023-11-30 DIAGNOSIS — D2371 Other benign neoplasm of skin of right lower limb, including hip: Secondary | ICD-10-CM | POA: Diagnosis not present

## 2023-11-30 DIAGNOSIS — D2372 Other benign neoplasm of skin of left lower limb, including hip: Secondary | ICD-10-CM | POA: Diagnosis not present

## 2023-11-30 NOTE — Progress Notes (Signed)
   Chief Complaint  Patient presents with   Callouses    Pt is here to f/u on bilateral callous to the bottom of her feet, she states there are still something there.      Subjective: 73 y.o. female presenting to the office today for complaint of generalized foot pain when walking as well as calluses that build up around the bilateral feet.  No history of injury.  Chronic.   Past Medical History:  Diagnosis Date   Allergic rhinitis    DVT (deep venous thrombosis) (HCC) 2008   GERD (gastroesophageal reflux disease)    Persistent headaches    Varicose veins during pregnancy, antepartum     Past Surgical History:  Procedure Laterality Date   none     VARICOSE VEIN SURGERY Bilateral     No Known Allergies   Objective:  Physical Exam General: Alert and oriented x3 in no acute distress  Dermatology: Hyperkeratotic lesion(s) present on the plantar aspect bilateral feet. Submet head bilateral and submet base bilateral.  Pain on palpation with a central nucleated core noted. Skin is warm, dry and supple bilateral lower extremities. Negative for open lesions or macerations.  Vascular: Palpable pedal pulses bilaterally. No edema or erythema noted. Capillary refill within normal limits.  Neurological: Grossly intact via light touch  Musculoskeletal Exam: Pain on palpation at the keratotic lesion(s) noted. Range of motion within normal limits bilateral. Muscle strength 5/5 in all groups bilateral.  Assessment: 1.  Eccrine poroma bilateral 2.  Generalized foot pain bilateral  Plan of Care:  -Patient evaluated -Excisional debridement of keratoic lesion(s) using a 15 blade and 3mm curette was performed without incident.  - Salicylic acid and a Band-Aid applied to the right fifth met head and left fitth met base lesions  -recommended updating shoes on a more frequent basis -Return to clinic 6- 8 weeks

## 2024-01-11 ENCOUNTER — Ambulatory Visit: Admitting: Podiatry

## 2024-01-27 DIAGNOSIS — I809 Phlebitis and thrombophlebitis of unspecified site: Secondary | ICD-10-CM | POA: Diagnosis not present

## 2024-01-27 DIAGNOSIS — Z86718 Personal history of other venous thrombosis and embolism: Secondary | ICD-10-CM | POA: Diagnosis not present

## 2024-02-17 DIAGNOSIS — I82531 Chronic embolism and thrombosis of right popliteal vein: Secondary | ICD-10-CM | POA: Diagnosis not present

## 2024-02-17 DIAGNOSIS — I82541 Chronic embolism and thrombosis of right tibial vein: Secondary | ICD-10-CM | POA: Diagnosis not present

## 2024-02-17 DIAGNOSIS — I82511 Chronic embolism and thrombosis of right femoral vein: Secondary | ICD-10-CM | POA: Diagnosis not present

## 2024-02-29 ENCOUNTER — Ambulatory Visit (INDEPENDENT_AMBULATORY_CARE_PROVIDER_SITE_OTHER): Admitting: Podiatry

## 2024-02-29 VITALS — Ht 68.0 in | Wt 245.0 lb

## 2024-02-29 DIAGNOSIS — D2371 Other benign neoplasm of skin of right lower limb, including hip: Secondary | ICD-10-CM | POA: Diagnosis not present

## 2024-02-29 NOTE — Progress Notes (Signed)
   Chief Complaint  Patient presents with   Foot Pain    RM 7  Patient is here for for painful callus on the right foot plantar aspect.    Subjective: 73 y.o. female presenting to the office today for evaluation of symptomatic skin lesions to the plantar aspect of bilateral feet   Past Medical History:  Diagnosis Date   Allergic rhinitis    DVT (deep venous thrombosis) (HCC) 2008   GERD (gastroesophageal reflux disease)    Persistent headaches    Varicose veins during pregnancy, antepartum     Past Surgical History:  Procedure Laterality Date   none     VARICOSE VEIN SURGERY Bilateral     No Known Allergies   Objective:  Physical Exam General: Alert and oriented x3 in no acute distress  Dermatology: Hyperkeratotic lesion(s) present on the plantar aspect of the bilateral forefoot. Pain on palpation with a central nucleated core noted. Skin is warm, dry and supple bilateral lower extremities. Negative for open lesions or macerations.  Vascular: Palpable pedal pulses bilaterally. No edema or erythema noted. Capillary refill within normal limits.  Neurological: Grossly intact via light touch  Musculoskeletal Exam: Pain on palpation at the keratotic lesion(s) noted. Range of motion within normal limits bilateral. Muscle strength 5/5 in all groups bilateral.  Assessment: 1.  Eccrine poroma bilateral  Eccrine poroma of foot, right  Plan of Care:  -Patient evaluated -Excisional debridement of keratoic lesion(s) using a chisel blade was performed without incident.  -Salicylic acid applied with a bandaid -Return to the clinic PRN.   Thresa EMERSON Sar, DPM [facility]  Dr. Thresa EMERSON Sar, DPM    [address] [place], [place] [address]                Office [phone]  Fax [phone]

## 2024-03-07 ENCOUNTER — Inpatient Hospital Stay: Attending: Hematology and Oncology

## 2024-03-07 DIAGNOSIS — Z86711 Personal history of pulmonary embolism: Secondary | ICD-10-CM | POA: Insufficient documentation

## 2024-03-07 DIAGNOSIS — Z7901 Long term (current) use of anticoagulants: Secondary | ICD-10-CM | POA: Insufficient documentation

## 2024-03-07 DIAGNOSIS — I82411 Acute embolism and thrombosis of right femoral vein: Secondary | ICD-10-CM | POA: Diagnosis present

## 2024-03-07 DIAGNOSIS — I83893 Varicose veins of bilateral lower extremities with other complications: Secondary | ICD-10-CM

## 2024-03-07 LAB — CBC WITH DIFFERENTIAL (CANCER CENTER ONLY)
Abs Immature Granulocytes: 0.02 K/uL (ref 0.00–0.07)
Basophils Absolute: 0 K/uL (ref 0.0–0.1)
Basophils Relative: 0 %
Eosinophils Absolute: 0.2 K/uL (ref 0.0–0.5)
Eosinophils Relative: 3 %
HCT: 43.5 % (ref 36.0–46.0)
Hemoglobin: 14.3 g/dL (ref 12.0–15.0)
Immature Granulocytes: 0 %
Lymphocytes Relative: 46 %
Lymphs Abs: 2.5 K/uL (ref 0.7–4.0)
MCH: 29.7 pg (ref 26.0–34.0)
MCHC: 32.9 g/dL (ref 30.0–36.0)
MCV: 90.4 fL (ref 80.0–100.0)
Monocytes Absolute: 0.4 K/uL (ref 0.1–1.0)
Monocytes Relative: 8 %
Neutro Abs: 2.4 K/uL (ref 1.7–7.7)
Neutrophils Relative %: 43 %
Platelet Count: 199 K/uL (ref 150–400)
RBC: 4.81 MIL/uL (ref 3.87–5.11)
RDW: 12.8 % (ref 11.5–15.5)
WBC Count: 5.6 K/uL (ref 4.0–10.5)
nRBC: 0 % (ref 0.0–0.2)

## 2024-03-07 LAB — BASIC METABOLIC PANEL - CANCER CENTER ONLY
Anion gap: 7 (ref 5–15)
BUN: 19 mg/dL (ref 8–23)
CO2: 25 mmol/L (ref 22–32)
Calcium: 10 mg/dL (ref 8.9–10.3)
Chloride: 109 mmol/L (ref 98–111)
Creatinine: 1.02 mg/dL — ABNORMAL HIGH (ref 0.44–1.00)
GFR, Estimated: 59 mL/min — ABNORMAL LOW (ref >60)
Glucose, Bld: 110 mg/dL — ABNORMAL HIGH (ref 70–99)
Potassium: 4.6 mmol/L (ref 3.5–5.1)
Sodium: 141 mmol/L (ref 135–145)

## 2024-03-07 LAB — D-DIMER, QUANTITATIVE: D-Dimer, Quant: 1.48 ug{FEU}/mL — ABNORMAL HIGH (ref 0.00–0.50)

## 2024-03-15 ENCOUNTER — Encounter: Payer: Self-pay | Admitting: Hematology and Oncology

## 2024-03-15 ENCOUNTER — Inpatient Hospital Stay: Admitting: Hematology and Oncology

## 2024-03-15 VITALS — BP 141/83 | HR 64 | Temp 97.7°F | Resp 18 | Ht 68.0 in | Wt 247.2 lb

## 2024-03-15 DIAGNOSIS — Z7901 Long term (current) use of anticoagulants: Secondary | ICD-10-CM | POA: Diagnosis not present

## 2024-03-15 DIAGNOSIS — Z86711 Personal history of pulmonary embolism: Secondary | ICD-10-CM | POA: Diagnosis not present

## 2024-03-15 DIAGNOSIS — I82411 Acute embolism and thrombosis of right femoral vein: Secondary | ICD-10-CM | POA: Diagnosis not present

## 2024-03-15 NOTE — Progress Notes (Signed)
 [facility] OFFICE PROGRESS NOTE  Patient Care Team: Gerome Brunet, DO as PCP - General (Family Medicine)  Assessment & Plan Acute deep vein thrombosis (DVT) of femoral vein of right lower extremity [facility]) She was diagnosed with acute right lower extremity DVT in April 2025  Overall, I felt that it could be provoked due to poor mobility with her job, poor oral fluid intake and deranged vasculature from prior vein surgeries contributing to the diagnosis  I recommend minimum 6 months of anticoagulation therapy  I reviewed recent blood work which show elevated D-dimer Her mobility is still not great as she does not exercise on a routine basis She attempted to increase oral fluid intake Overall, I do not feel strongly she can come off anticoagulation therapy We discussed the role of extended duration anticoagulation treatment We discussed dosing to either continue at 5 mg versus 2.5 mg and ultimately, she agreed to continue at 5 mg twice daily I will reassess again in 6 months At this point in time, there is no benefit of screening for thrombophilic disorder  Orders Placed This Encounter  Procedures   CBC with Differential (Cancer Center Only)    Standing Status:   Future    Expiration Date:   03/15/2025   CMP (Cancer Center only)    Standing Status:   Future    Expiration Date:   03/15/2025   D-dimer, quantitative    Standing Status:   Future    Expiration Date:   03/15/2025     Pamela Bedford, MD  INTERVAL HISTORY: she returns for surveillance follow-up for history of DVT/PE Patient denies recent bleeding such as epistaxis, hematuria or hematochezia We reviewed medication list and discussed medication changes We discussed test results and future plan of care as outlined above  PHYSICAL EXAMINATION: ECOG PERFORMANCE STATUS: 1 - Symptomatic but completely ambulatory  Vitals:   03/15/24 0927  BP: (!) 141/83  Pulse: 64  Resp: 18  Temp: 97.7 F (36.5 C)  SpO2:  100%   Lab Results  Component Value Date   WBC 5.6 03/07/2024   HGB 14.3 03/07/2024   HCT 43.5 03/07/2024   MCV 90.4 03/07/2024   PLT 199 03/07/2024    SUMMARY OF HEMATOLOGIC HISTORY:  Pamela Flores 73 y.o. female is here because of recent findings of recurrent DVT The patient has remote history of left lower extremity DVT Around Christmas of 2008, the patient fell at home and was not able to move overnight She subsequently was able to get up and went to the emergency department for evaluation and at that time, she had left lower extremity pain but was not evaluated until January 2029 According to venous Doppler ultrasound dated June 18, 2007, acute thrombus was seen in the left lower extremity common femoral vein to popliteal vein The patient was anticoagulated with warfarin On February 26, 2008, she had repeat Doppler ultrasound and was found to have nonocclusive old thrombus of the left popliteal region Her anticoagulation was subsequently discontinued  The patient had venous surgery a year ago for varicose vein She was seen by her vascular surgeon in April 2025 and was found to have acute thrombus in the right lower extremity The actual venous Doppler ultrasound report was not available but she was sent to the emergency department on the same day on September 09, 2023 for CT imaging which showed no evidence of PE.  This time, she developed acute DVT on the right femoral vein.  According to the  patient, she could not recall any trauma leading to the diagnosis She works part-time in her job requires her to be sitting or standing and she has a somewhat sedentary lifestyle.  She drinks approximately 48 ounces of liquid. There were no preceding surgery, long distance travel, hormone use, smoking or trauma to the right lower extremity She is up-to-date with all screening except she has not have colonoscopy done ever.  She has never been diagnosed with cancer When she was found to have acute  DVT, she was placed on Eliquis .  She denies bleeding complications She had prior surgeries before and never had perioperative thromboembolic events. The patient had never been exposed to birth control pills or hormone replacement therapy  The patient had never been pregnant before

## 2024-03-15 NOTE — Assessment & Plan Note (Addendum)
 She was diagnosed with acute right lower extremity DVT in April 2025  Overall, I felt that it could be provoked due to poor mobility with her job, poor oral fluid intake and deranged vasculature from prior vein surgeries contributing to the diagnosis  I recommend minimum 6 months of anticoagulation therapy  I reviewed recent blood work which show elevated D-dimer Her mobility is still not great as she does not exercise on a routine basis She attempted to increase oral fluid intake Overall, I do not feel strongly she can come off anticoagulation therapy We discussed the role of extended duration anticoagulation treatment We discussed dosing to either continue at 5 mg versus 2.5 mg and ultimately, she agreed to continue at 5 mg twice daily I will reassess again in 6 months At this point in time, there is no benefit of screening for thrombophilic disorder

## 2024-04-05 DIAGNOSIS — M5134 Other intervertebral disc degeneration, thoracic region: Secondary | ICD-10-CM | POA: Diagnosis not present

## 2024-04-05 DIAGNOSIS — G44209 Tension-type headache, unspecified, not intractable: Secondary | ICD-10-CM | POA: Diagnosis not present

## 2024-04-05 DIAGNOSIS — Z566 Other physical and mental strain related to work: Secondary | ICD-10-CM | POA: Diagnosis not present

## 2024-09-06 ENCOUNTER — Inpatient Hospital Stay

## 2024-09-13 ENCOUNTER — Inpatient Hospital Stay: Admitting: Hematology and Oncology
